# Patient Record
Sex: Male | Born: 1950 | Race: White | Hispanic: No | Marital: Married | Smoking: Former smoker
Health system: Southern US, Community
[De-identification: ages and names within clinical notes are randomized; demographics above are authoritative.]

## PROBLEM LIST (undated history)

## (undated) DIAGNOSIS — E785 Hyperlipidemia, unspecified: Secondary | ICD-10-CM

## (undated) DIAGNOSIS — K625 Hemorrhage of anus and rectum: Secondary | ICD-10-CM

## (undated) DIAGNOSIS — D649 Anemia, unspecified: Secondary | ICD-10-CM

## (undated) HISTORY — DX: Hemorrhage of anus and rectum: K62.5

## (undated) HISTORY — PX: TONSILLECTOMY AND ADENOIDECTOMY: SHX28

## (undated) HISTORY — DX: Hyperlipidemia, unspecified: E78.5

## (undated) HISTORY — PX: OTHER SURGICAL HISTORY: SHX169

---

## 1988-05-09 HISTORY — PX: SHOULDER SURGERY: SHX246

## 2001-11-18 ENCOUNTER — Observation Stay (HOSPITAL_COMMUNITY): Admission: EM | Admit: 2001-11-18 | Discharge: 2001-11-19 | Payer: Self-pay | Admitting: Emergency Medicine

## 2001-11-28 ENCOUNTER — Encounter: Admission: RE | Admit: 2001-11-28 | Discharge: 2001-11-28 | Payer: Self-pay | Admitting: Internal Medicine

## 2004-09-15 ENCOUNTER — Ambulatory Visit (HOSPITAL_COMMUNITY): Admission: RE | Admit: 2004-09-15 | Discharge: 2004-09-15 | Payer: Self-pay | Admitting: Orthopedic Surgery

## 2005-11-24 ENCOUNTER — Ambulatory Visit: Payer: Self-pay

## 2010-05-30 ENCOUNTER — Encounter: Payer: Self-pay | Admitting: Orthopedic Surgery

## 2011-01-19 ENCOUNTER — Ambulatory Visit (INDEPENDENT_AMBULATORY_CARE_PROVIDER_SITE_OTHER): Payer: 59 | Admitting: Surgery

## 2011-01-19 ENCOUNTER — Encounter (INDEPENDENT_AMBULATORY_CARE_PROVIDER_SITE_OTHER): Payer: Self-pay | Admitting: Surgery

## 2011-01-19 VITALS — BP 108/74 | HR 60 | Temp 96.8°F | Ht 71.0 in | Wt 163.1 lb

## 2011-01-19 DIAGNOSIS — K4020 Bilateral inguinal hernia, without obstruction or gangrene, not specified as recurrent: Secondary | ICD-10-CM | POA: Insufficient documentation

## 2011-01-19 DIAGNOSIS — R1031 Right lower quadrant pain: Secondary | ICD-10-CM

## 2011-01-19 DIAGNOSIS — K4090 Unilateral inguinal hernia, without obstruction or gangrene, not specified as recurrent: Secondary | ICD-10-CM

## 2011-01-19 NOTE — Progress Notes (Signed)
Subjective:     Patient ID: David Reyes, male   DOB: [birth year], 60 y.o.   MRN: [id number]  HPI  Reason for visit: Left groin swelling. Question of hernia.  Patient is a active male who noticed left groin pain and swelling earlier this year. He has a friend whom I did a repair of hernia & he recommended to come & be seen by me. The patient notes that the areas gotten more sensitive and larger in size. It gets worse when he feels constipated. He also gets worse when he is working. 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 He noticed some discomfort on the right side as well. He is due to get a colonoscopy. He has a bowel movement every day but claims he gets constipated at times. No prior abdominal nor hernia surgeries.  Past Medical History  Diagnosis Date  . Hyperlipidemia   . Constipation   . Abdominal pain   . Rectal bleeding    Past Surgical History  Procedure Date  . Tonsillectomy and adenoidectomy   . Dental implants    Current outpatient prescriptions:aspirin 200 MG suppository, Place 200 mg rectally every 6 (six) hours as needed.  , Disp: , Rfl: ;  CIALIS 5 MG tablet, , Disp: , Rfl: ;  clobetasol (TEMOVATE) 0.05 % cream, , Disp: , Rfl: ;  fluocinonide (LIDEX) 0.05 % external solution, , Disp: , Rfl: ;  Glucosamine-Chondroit-Vit C-Mn (GLUCOSAMINE 1500 COMPLEX PO), Take by mouth daily.  , Disp: , Rfl:  MINOXIDIL, TOPICAL, (ROGAINE EXTRA STRENGTH) 5 % SOLN, Apply topically 2 (two) times daily.  , Disp: , Rfl: ;  Multiple Vitamin (MULTIVITAMIN PO), Take by mouth daily.  , Disp: , Rfl: ;  Red Yeast Rice Extract (RED YEAST RICE PO), Take 1,200 mg elemental calcium/kg/hr by mouth.  , Disp: , Rfl: ;  SUPREP BOWEL PREP SOLN, , Disp: , Rfl:   No Known Allergies  History   Social History  . Marital Status: Married    Spouse Name: N/A    Number of Children: N/A  . Years of Education: N/A   Occupational History  . Not  on file.   Social History Main Topics  . Smoking status: Current Everyday Smoker -- 0.5 packs/day for 15 years  . Smokeless tobacco: Not on file  . Alcohol Use: No  . Drug Use: No  . Sexually Active:    Other Topics Concern  . Not on file   Social History Narrative  . No narrative on file     Review of Systems  Constitutional: Negative for fever, chills and diaphoresis.  HENT: Negative for nosebleeds, sore throat, facial swelling, mouth sores, trouble swallowing and ear discharge.   Eyes: Negative for photophobia, discharge and visual disturbance.  Respiratory: Negative for choking, chest tightness, shortness of breath and stridor.   Cardiovascular: Negative for chest pain, palpitations and leg swelling.       Walks 2 miles/day w/o problems  Gastrointestinal: Negative for nausea, vomiting, abdominal pain, diarrhea, constipation, blood in stool, abdominal distention, anal bleeding and rectal pain.  Genitourinary: Negative for dysuria, urgency, frequency, discharge, penile swelling, scrotal swelling, difficulty urinating, penile pain and testicular pain.  Musculoskeletal: Negative for myalgias, back pain, arthralgias and gait problem.  Skin: Negative for color change, pallor, rash and wound.  Neurological: Negative for dizziness, speech difficulty, weakness, numbness and headaches.  Hematological: Negative for adenopathy. Does not bruise/bleed easily.  Psychiatric/Behavioral: Negative for hallucinations, confusion and agitation.       Objective:   Physical Exam  Constitutional: He is oriented to person, place, and time. He appears well-developed and well-nourished. No distress.  HENT:  Head: Normocephalic.  Mouth/Throat: Oropharynx is clear and moist. No oropharyngeal exudate.  Eyes: Conjunctivae and EOM are normal. Pupils are equal, round, and reactive to light. No scleral icterus.  Neck: Normal range of motion. Neck supple. No tracheal deviation present.  Cardiovascular:  Normal rate, regular rhythm and intact distal pulses.   Pulmonary/Chest: Effort normal and breath sounds normal. No respiratory distress.  Abdominal: Soft. He exhibits no distension and no mass. There is no tenderness. There is no rebound and no guarding. Hernia confirmed negative in the right inguinal area and confirmed negative in the left inguinal area.  Genitourinary: Penis normal. No penile tenderness.       Small LIH.  Possible RIH on Valsalva  Musculoskeletal: Normal range of motion. He exhibits no tenderness.  Lymphadenopathy:    He has no cervical adenopathy.       Right: No inguinal adenopathy present.       Left: No inguinal adenopathy present.  Neurological: He is alert and oriented to person, place, and time. No cranial nerve deficit. He exhibits normal muscle tone. Coordination normal.  Skin: Skin is warm and dry. No rash noted. He is not diaphoretic. No erythema. No pallor.  Psychiatric: He has a normal mood and affect. His behavior is normal. Judgment and thought content normal.       Assessment:     Small but definite left inguinal hernia. Probable right inguinal hernia as well.    Plan:     I think he would benefit from exploration of both groins laparoscopically. He wishes to try and get this done before the end of the month. I spent a long period time clarifying the pathophysiology and the technique.  The anatomy & physiology of the abdominal wall was discussed.  The pathophysiology of hernias was discussed.  Natural history risks without surgery of enlargement, pain, incarceration & strangulation was discussed.   Contributors to complications such as smoking, obesity, diabetes, prior surgery, etc were discussed.  I feel the risks of no intervention will lead to serious problems that outweigh the operative risks; therefore, I recommended surgery to reduce and repair the hernia.  I explained laparoscopic techniques with possible need for an open approach.  I noted the  probable use of mesh to patch and/or buttress hernia repair  Risks such as bleeding, infection, abscess, need for further treatment, heart attack, death, and other risks were discussed.  Goals of post-operative recovery were discussed as well.  Possibility that this will not correct all symptoms was explained.  I stressed the importance of low-impact activity, aggressive pain control, avoiding constipation, & not pushing through pain to minimize risk of post-operative chronic pain or injury. Possibility of reherniation was discussed.  We will work to minimize complications.   An educational handout further explaining the pathology & treatment options was given as well.  Questions were answered.  The patient expresses understanding & wishes to proceed with surgery.

## 2011-01-19 NOTE — Patient Instructions (Signed)
See Handout.  Consider Surgery to repair hernia(s) in groin(s).

## 2011-01-27 DIAGNOSIS — K4020 Bilateral inguinal hernia, without obstruction or gangrene, not specified as recurrent: Secondary | ICD-10-CM

## 2011-01-27 HISTORY — PX: HERNIA REPAIR: SHX51

## 2011-02-07 ENCOUNTER — Ambulatory Visit (INDEPENDENT_AMBULATORY_CARE_PROVIDER_SITE_OTHER): Payer: 59 | Admitting: Surgery

## 2011-02-07 ENCOUNTER — Encounter (INDEPENDENT_AMBULATORY_CARE_PROVIDER_SITE_OTHER): Payer: 59 | Admitting: Surgery

## 2011-02-07 ENCOUNTER — Encounter (INDEPENDENT_AMBULATORY_CARE_PROVIDER_SITE_OTHER): Payer: Self-pay | Admitting: Surgery

## 2011-02-07 VITALS — BP 108/78 | HR 72 | Temp 96.2°F | Resp 16 | Ht 70.5 in | Wt 160.1 lb

## 2011-02-07 DIAGNOSIS — K649 Unspecified hemorrhoids: Secondary | ICD-10-CM | POA: Insufficient documentation

## 2011-02-07 DIAGNOSIS — K4020 Bilateral inguinal hernia, without obstruction or gangrene, not specified as recurrent: Secondary | ICD-10-CM

## 2011-02-07 NOTE — Progress Notes (Signed)
Subjective:     Patient ID: David Reyes, male   DOB: Jan 01, 1951, 60 y.o.   MRN: [id number]  HPI  Patient Care Team: Verneita Griffes as PCP - General (Family Medicine)  This patient is a 60 y.o.male who presents today for surgical evaluation.   Diagnosis: Bilateral inguinal hernias  Procedure: Laparoscopic bilateral inguinal hernia repair 01/27/2011  Reason for visit: Followup  Patient comes today feeling better. He had some bad constipation the first 2 days. Eventually, he is a bowel movement on postop day #3. It was rather uncomfortable for him. He noted his hemorrhoids flared up to form a lump. That is calming down. No hematochezia. No fevers chills or sweats.  He had moderate bruising as well. That has begun to nearly resolve. He did not tolerate the oxycodone well. It made him feel rather loopy and recurrent rather constipated. He's been using ibuprofen & it seems to help. He is hesitant to go out and walk and do his usual brisk exercise/walking.  No fevers chills or sweats. Urinating okay.   Past Medical History  Diagnosis Date  . Hyperlipidemia   . Constipation   . Abdominal pain   . Rectal bleeding     Past Surgical History  Procedure Date  . Tonsillectomy and adenoidectomy   . Dental implants   . Hernia repair 01/27/11    bilateral inguinal hernia     History   Social History  . Marital Status: Married    Spouse Name: N/A    Number of Children: N/A  . Years of Education: N/A   Occupational History  . Not on file.   Social History Main Topics  . Smoking status: Current Everyday Smoker -- 0.5 packs/day for 15 years  . Smokeless tobacco: Not on file  . Alcohol Use: No  . Drug Use: No  . Sexually Active:    Other Topics Concern  . Not on file   Social History Narrative  . No narrative on file    Family History  Problem Relation Age of Onset  . COPD Mother     Current outpatient prescriptions:aspirin 200 MG suppository, Place 200 mg rectally every  6 (six) hours as needed.  , Disp: , Rfl: ;  CIALIS 5 MG tablet, , Disp: , Rfl: ;  clobetasol (TEMOVATE) 0.05 % cream, , Disp: , Rfl: ;  fluocinonide (LIDEX) 0.05 % external solution, , Disp: , Rfl: ;  Glucosamine-Chondroit-Vit C-Mn (GLUCOSAMINE 1500 COMPLEX PO), Take by mouth daily.  , Disp: , Rfl:  MINOXIDIL, TOPICAL, (ROGAINE EXTRA STRENGTH) 5 % SOLN, Apply topically 2 (two) times daily.  , Disp: , Rfl: ;  Multiple Vitamin (MULTIVITAMIN PO), Take by mouth daily.  , Disp: , Rfl: ;  Red Yeast Rice Extract (RED YEAST RICE PO), Take 1,200 mg elemental calcium/kg/hr by mouth.  , Disp: , Rfl: ;  SUPREP BOWEL PREP SOLN, , Disp: , Rfl:   No Known Allergies     Review of Systems  Constitutional: Negative for fever, chills and diaphoresis.  HENT: Negative for nosebleeds, sore throat, facial swelling, mouth sores, trouble swallowing, neck pain and ear discharge.   Eyes: Negative for photophobia, discharge and visual disturbance.  Respiratory: Negative for choking, chest tightness, shortness of breath and stridor.   Cardiovascular: Negative for chest pain and palpitations.  Gastrointestinal: Positive for constipation. Negative for nausea, vomiting, abdominal pain, diarrhea, blood in stool, abdominal distention, anal bleeding and rectal pain.       Better controlled w 5  prunes/day  Genitourinary: Negative for dysuria, urgency, difficulty urinating and testicular pain.  Musculoskeletal: Negative for myalgias, back pain, arthralgias and gait problem.  Skin: Negative for color change, pallor, rash and wound.  Neurological: Negative for dizziness, speech difficulty, weakness, numbness and headaches.  Hematological: Negative for adenopathy. Does not bruise/bleed easily.  Psychiatric/Behavioral: Negative for hallucinations, confusion and agitation.       Objective:   Physical Exam  Constitutional: He is oriented to person, place, and time. He appears well-developed and well-nourished. No distress.  HENT:    Head: Normocephalic.  Mouth/Throat: Oropharynx is clear and moist. No oropharyngeal exudate.  Eyes: Conjunctivae and EOM are normal. Pupils are equal, round, and reactive to light. No scleral icterus.  Neck: Normal range of motion. No tracheal deviation present.  Cardiovascular: Normal rate, normal heart sounds and intact distal pulses.   Pulmonary/Chest: Effort normal. No respiratory distress.  Abdominal: Soft. He exhibits no distension and no mass. There is no tenderness. There is no rebound and no guarding. Hernia confirmed negative in the right inguinal area and confirmed negative in the left inguinal area.       Incisions clean with normal healing ridges.  No hernias  Genitourinary: Penis normal. No penile tenderness.       Min ecchymosis on genitals.  Declined rectal exam  Musculoskeletal: Normal range of motion. He exhibits no tenderness.  Neurological: He is alert and oriented to person, place, and time. No cranial nerve deficit. He exhibits normal muscle tone. Coordination normal.  Skin: Skin is warm and dry. No rash noted. He is not diaphoretic. No erythema.  Psychiatric: He has a normal mood and affect. His behavior is normal.       Assessment:     POD #12 Status post laparoscopic repairs. Recovering well.  Hemorrhoids with recent flare of constipation.  Improved with adding prunes   Plan:     Okay to walk. Do not push through pain. Increase nonsteroidal use if needed to have better exercise tolerance.  I discussed about hemorrhoids.  The anatomy & physiology of the anorectal region was discussed.  The pathophysiology of hemorrhoids and differential diagnosis was discussed.  Natural history progression  was discussed.   I stressed the importance of a bowel regimen to have daily soft bowel movements to minimize progression of disease.   Educational handouts further explaining the pathology, treatment options, and bowel regimen were given as well.   It does not sound like a  fissure or fistula. I noted if things do not improve I can examine the area. However, it seems mild now and improving. He felt reassured  Since he has made improvements less than 2 weeks out, I think he can return to clinic p.r.n. He felt comfortable calling me if things do not improve or he worsens.

## 2011-02-07 NOTE — Patient Instructions (Signed)

## 2011-04-06 ENCOUNTER — Encounter (INDEPENDENT_AMBULATORY_CARE_PROVIDER_SITE_OTHER): Payer: Self-pay

## 2017-12-13 ENCOUNTER — Ambulatory Visit
Admission: RE | Admit: 2017-12-13 | Discharge: 2017-12-13 | Disposition: A | Discharge status: Home / Self Care | Payer: 59 | Source: Ambulatory Visit | Attending: Adult Health | Admitting: Adult Health

## 2017-12-13 ENCOUNTER — Other Ambulatory Visit: Payer: Self-pay | Admitting: Adult Health

## 2017-12-13 DIAGNOSIS — R599 Enlarged lymph nodes, unspecified: Secondary | ICD-10-CM

## 2018-01-24 ENCOUNTER — Other Ambulatory Visit: Payer: Self-pay | Admitting: Adult Health

## 2018-01-24 DIAGNOSIS — K5900 Constipation, unspecified: Secondary | ICD-10-CM

## 2018-01-24 DIAGNOSIS — Z136 Encounter for screening for cardiovascular disorders: Secondary | ICD-10-CM

## 2018-02-19 ENCOUNTER — Ambulatory Visit
Admission: RE | Admit: 2018-02-19 | Discharge: 2018-02-19 | Disposition: A | Discharge status: Home / Self Care | Payer: Managed Care, Other (non HMO) | Source: Ambulatory Visit | Attending: Adult Health | Admitting: Adult Health

## 2018-02-19 DIAGNOSIS — Z136 Encounter for screening for cardiovascular disorders: Secondary | ICD-10-CM

## 2018-03-23 ENCOUNTER — Telehealth: Payer: Self-pay | Admitting: Gastroenterology

## 2018-03-23 NOTE — Telephone Encounter (Signed)
I reviewed the notes. Multiple Cold snare and hot snare polypectomies. I think scheduling colonoscopy is reasonable, but please get the pathology from the polyps. Thanks.

## 2018-04-12 NOTE — Telephone Encounter (Signed)
Called and LM on Vmail to call back °

## 2018-06-26 ENCOUNTER — Other Ambulatory Visit: Payer: Self-pay | Admitting: Orthopedic Surgery

## 2018-06-29 ENCOUNTER — Other Ambulatory Visit: Payer: Self-pay

## 2018-06-29 ENCOUNTER — Encounter (HOSPITAL_BASED_OUTPATIENT_CLINIC_OR_DEPARTMENT_OTHER): Payer: Self-pay | Admitting: *Deleted

## 2018-07-10 ENCOUNTER — Encounter (HOSPITAL_BASED_OUTPATIENT_CLINIC_OR_DEPARTMENT_OTHER): Payer: Self-pay | Admitting: *Deleted

## 2018-07-10 ENCOUNTER — Ambulatory Visit (HOSPITAL_BASED_OUTPATIENT_CLINIC_OR_DEPARTMENT_OTHER): Payer: Managed Care, Other (non HMO) | Admitting: Certified Registered"

## 2018-07-10 ENCOUNTER — Encounter (HOSPITAL_BASED_OUTPATIENT_CLINIC_OR_DEPARTMENT_OTHER)
Admission: RE | Disposition: A | Discharge status: Home / Self Care | Payer: Self-pay | Source: Home / Self Care | Attending: Orthopedic Surgery

## 2018-07-10 ENCOUNTER — Ambulatory Visit (HOSPITAL_BASED_OUTPATIENT_CLINIC_OR_DEPARTMENT_OTHER)
Admission: RE | Admit: 2018-07-10 | Discharge: 2018-07-10 | Disposition: A | Discharge status: Home / Self Care | Payer: Managed Care, Other (non HMO) | Attending: Orthopedic Surgery | Admitting: Orthopedic Surgery

## 2018-07-10 ENCOUNTER — Other Ambulatory Visit: Payer: Self-pay

## 2018-07-10 DIAGNOSIS — F17200 Nicotine dependence, unspecified, uncomplicated: Secondary | ICD-10-CM | POA: Insufficient documentation

## 2018-07-10 DIAGNOSIS — G5603 Carpal tunnel syndrome, bilateral upper limbs: Secondary | ICD-10-CM | POA: Insufficient documentation

## 2018-07-10 DIAGNOSIS — E785 Hyperlipidemia, unspecified: Secondary | ICD-10-CM | POA: Insufficient documentation

## 2018-07-10 HISTORY — PX: CARPAL TUNNEL RELEASE: SHX101

## 2018-07-10 HISTORY — DX: Anemia, unspecified: D64.9

## 2018-07-10 SURGERY — CARPAL TUNNEL RELEASE
Anesthesia: Regional | Site: Wrist | Laterality: Right

## 2018-07-10 MED ORDER — BUPIVACAINE HCL (PF) 0.25 % IJ SOLN
INTRAMUSCULAR | Status: DC | PRN
  Administered 2018-07-10: 10 mL

## 2018-07-10 MED ORDER — ONDANSETRON HCL 4 MG/2ML IJ SOLN
INTRAMUSCULAR | Status: DC | PRN
  Administered 2018-07-10: 4 mg via INTRAVENOUS

## 2018-07-10 MED ORDER — ONDANSETRON HCL 4 MG/2ML IJ SOLN
INTRAMUSCULAR | Status: AC
  Filled 2018-07-10: qty 2

## 2018-07-10 MED ORDER — MIDAZOLAM HCL 2 MG/2ML IJ SOLN
INTRAMUSCULAR | Status: AC
  Filled 2018-07-10: qty 2

## 2018-07-10 MED ORDER — FENTANYL CITRATE (PF) 100 MCG/2ML IJ SOLN
50.0000 ug | INTRAMUSCULAR | Status: DC | PRN
  Administered 2018-07-10: 50 ug via INTRAVENOUS

## 2018-07-10 MED ORDER — CEFAZOLIN SODIUM-DEXTROSE 2-4 GM/100ML-% IV SOLN
INTRAVENOUS | Status: AC
  Filled 2018-07-10: qty 100

## 2018-07-10 MED ORDER — CHLORHEXIDINE GLUCONATE 4 % EX LIQD: 60.0000 mL | Freq: Once | CUTANEOUS | Status: DC

## 2018-07-10 MED ORDER — PROPOFOL 500 MG/50ML IV EMUL
INTRAVENOUS | Status: AC
  Filled 2018-07-10: qty 50

## 2018-07-10 MED ORDER — CEFAZOLIN SODIUM-DEXTROSE 2-4 GM/100ML-% IV SOLN
2.0000 g | INTRAVENOUS | Status: AC
  Administered 2018-07-10: 2 g via INTRAVENOUS

## 2018-07-10 MED ORDER — MIDAZOLAM HCL 2 MG/2ML IJ SOLN
1.0000 mg | INTRAMUSCULAR | Status: DC | PRN
  Administered 2018-07-10: 1 mg via INTRAVENOUS

## 2018-07-10 MED ORDER — LACTATED RINGERS IV SOLN
INTRAVENOUS | Status: DC
  Administered 2018-07-10: 11:00:00 via INTRAVENOUS

## 2018-07-10 MED ORDER — SCOPOLAMINE 1 MG/3DAYS TD PT72: 1.0000 | MEDICATED_PATCH | Freq: Once | TRANSDERMAL | Status: DC | PRN

## 2018-07-10 MED ORDER — PROPOFOL 500 MG/50ML IV EMUL
INTRAVENOUS | Status: DC | PRN
  Administered 2018-07-10: 50 ug/kg/min via INTRAVENOUS

## 2018-07-10 MED ORDER — LIDOCAINE HCL (PF) 0.5 % IJ SOLN
INTRAMUSCULAR | Status: DC | PRN
  Administered 2018-07-10: 35 mL via INTRAVENOUS

## 2018-07-10 MED ORDER — TRAMADOL HCL 50 MG PO TABS: 50.0000 mg | ORAL_TABLET | Freq: Four times a day (QID) | ORAL | 0 refills | Status: DC | PRN

## 2018-07-10 MED ORDER — FENTANYL CITRATE (PF) 100 MCG/2ML IJ SOLN
INTRAMUSCULAR | Status: AC
  Filled 2018-07-10: qty 2

## 2018-07-10 SURGICAL SUPPLY — 39 items
BLADE SURG 15 STRL LF DISP TIS (BLADE) ×1 IMPLANT
BLADE SURG 15 STRL SS (BLADE) ×3
BNDG CMPR 9X4 STRL LF SNTH (GAUZE/BANDAGES/DRESSINGS) ×1
BNDG COHESIVE 3X5 TAN STRL LF (GAUZE/BANDAGES/DRESSINGS) ×3 IMPLANT
BNDG ESMARK 4X9 LF (GAUZE/BANDAGES/DRESSINGS) ×2 IMPLANT
BNDG GAUZE ELAST 4 BULKY (GAUZE/BANDAGES/DRESSINGS) ×3 IMPLANT
CHLORAPREP W/TINT 26ML (MISCELLANEOUS) ×3 IMPLANT
CORD BIPOLAR FORCEPS 12FT (ELECTRODE) ×3 IMPLANT
COVER BACK TABLE 60X90IN (DRAPES) ×3 IMPLANT
COVER MAYO STAND STRL (DRAPES) ×3 IMPLANT
COVER WAND RF STERILE (DRAPES) IMPLANT
CUFF TOURNIQUET SINGLE 18IN (TOURNIQUET CUFF) ×3 IMPLANT
DRAPE EXTREMITY T 121X128X90 (DISPOSABLE) ×3 IMPLANT
DRAPE SURG 17X23 STRL (DRAPES) ×3 IMPLANT
DRSG PAD ABDOMINAL 8X10 ST (GAUZE/BANDAGES/DRESSINGS) ×3 IMPLANT
GAUZE SPONGE 4X4 12PLY STRL (GAUZE/BANDAGES/DRESSINGS) ×3 IMPLANT
GAUZE XEROFORM 1X8 LF (GAUZE/BANDAGES/DRESSINGS) ×3 IMPLANT
GLOVE BIOGEL PI IND STRL 6.5 (GLOVE) IMPLANT
GLOVE BIOGEL PI IND STRL 7.0 (GLOVE) IMPLANT
GLOVE BIOGEL PI IND STRL 8.5 (GLOVE) ×1 IMPLANT
GLOVE BIOGEL PI INDICATOR 6.5 (GLOVE) ×2
GLOVE BIOGEL PI INDICATOR 7.0 (GLOVE) ×2
GLOVE BIOGEL PI INDICATOR 8.5 (GLOVE) ×2
GLOVE SURG ORTHO 8.0 STRL STRW (GLOVE) ×3 IMPLANT
GLOVE SURG SS PI 6.5 STRL IVOR (GLOVE) ×2 IMPLANT
GOWN STRL REUS W/ TWL LRG LVL3 (GOWN DISPOSABLE) ×1 IMPLANT
GOWN STRL REUS W/TWL LRG LVL3 (GOWN DISPOSABLE) ×3
GOWN STRL REUS W/TWL XL LVL3 (GOWN DISPOSABLE) ×3 IMPLANT
NDL PRECISIONGLIDE 27X1.5 (NEEDLE) IMPLANT
NEEDLE PRECISIONGLIDE 27X1.5 (NEEDLE) ×3 IMPLANT
NS IRRIG 1000ML POUR BTL (IV SOLUTION) ×3 IMPLANT
PACK BASIN DAY SURGERY FS (CUSTOM PROCEDURE TRAY) ×3 IMPLANT
STOCKINETTE 4X48 STRL (DRAPES) ×3 IMPLANT
SUT ETHILON 4 0 PS 2 18 (SUTURE) ×3 IMPLANT
SUT VICRYL 4-0 PS2 18IN ABS (SUTURE) IMPLANT
SYR BULB 3OZ (MISCELLANEOUS) ×3 IMPLANT
SYR CONTROL 10ML LL (SYRINGE) IMPLANT
TOWEL GREEN STERILE FF (TOWEL DISPOSABLE) ×3 IMPLANT
UNDERPAD 30X30 (UNDERPADS AND DIAPERS) ×1 IMPLANT

## 2018-07-10 NOTE — Anesthesia Postprocedure Evaluation (Signed)
Anesthesia Post Note  Patient: David Reyes  Procedure(s) Performed: CARPAL TUNNEL RELEASE (Right Wrist)     Patient location during evaluation: PACU Anesthesia Type: Bier Block Level of consciousness: awake and alert Pain management: pain level controlled Vital Signs Assessment: post-procedure vital signs reviewed and stable Respiratory status: spontaneous breathing, nonlabored ventilation and respiratory function stable Cardiovascular status: blood pressure returned to baseline and stable Postop Assessment: no apparent nausea or vomiting Anesthetic complications: no    Last Vitals:  Vitals:   07/10/18 1248 07/10/18 1300  BP:  122/71  Pulse: (!) 55 62  Resp: 14 18  Temp:  36.6 C  SpO2: 100% 96%    Last Pain:  Vitals:   07/10/18 1300  TempSrc:   PainSc: 0-No pain                 Lucretia Kern

## 2018-07-10 NOTE — Brief Op Note (Signed)
07/10/2018  12:33 PM  PATIENT:  David Reyes  68 y.o. male  PRE-OPERATIVE DIAGNOSIS:  RIGHT CARPAL TUNNEL SYNDROME  POST-OPERATIVE DIAGNOSIS:  RIGHT CARPAL TUNNEL SYNDROME  PROCEDURE:  Procedure(s) with comments: CARPAL TUNNEL RELEASE (Right) - FAB  SURGEON:  Surgeon(s) and Role:    * Cindee Salt, MD - Primary  PHYSICIAN ASSISTANT:   ASSISTANTS: none   ANESTHESIA:   local, regional and IV sedation  EBL:  60ml  BLOOD ADMINISTERED:none  DRAINS: none   LOCAL MEDICATIONS USED:  BUPIVICAINE   SPECIMEN:  No Specimen  DISPOSITION OF SPECIMEN:  N/A  COUNTS:  YES  TOURNIQUET:   Total Tourniquet Time Documented: Forearm (Right) - 24 minutes Total: Forearm (Right) - 24 minutes   DICTATION: .Dragon Dictation  PLAN OF CARE: Discharge to home after PACU  PATIENT DISPOSITION:  PACU - hemodynamically stable.

## 2018-07-10 NOTE — Op Note (Signed)
NAME: David Reyes MEDICAL RECORD NO: [id number] DATE OF BIRTH: 1950/09/21 FACILITY: [facility] LOCATION: [facility] PHYSICIAN: Nicki Reaper, MD   OPERATIVE REPORT   DATE OF PROCEDURE: 07/10/18    PREOPERATIVE DIAGNOSIS:   Carpal tunnel syndrome right hand   POSTOPERATIVE DIAGNOSIS:   Same   PROCEDURE:   Decompression median nerve right hand   SURGEON: Cindee Salt, M.D.   ASSISTANT: none   ANESTHESIA:  Bier block with sedation and Local   INTRAVENOUS FLUIDS:  Per anesthesia flow sheet.   ESTIMATED BLOOD LOSS:  Minimal.   COMPLICATIONS:  None.   SPECIMENS:  none   TOURNIQUET TIME:    Total Tourniquet Time Documented: Forearm (Right) - 24 minutes Total: Forearm (Right) - 24 minutes    DISPOSITION:  Stable to PACU.   INDICATIONS: Patient is a 68 year old male with a history of bilateral numbness and tingling.  Nerve conductions were revealed bilateral carpal tunnel syndrome.  This is not responded to conservative treatment he is elected to undergo surgical intervention with decompression median nerve right side.  Pre-peri-and postoperative course been discussed along with risks and complications.  He is aware there is no guarantee to the surgery the possibility of infection recurrence injury to arteries nerves tendons complete relief symptoms dystrophy he is advised we are attempting to halt the process of getting the nerve the opportunity to get better but cannot guarantee this.  The operative area the patient is seen extremity marked by both patient and surgeon antibiotic given  OPERATIVE COURSE: Patient is brought to the operating room where form based IV regional anesthetic was carried out without difficulty under the direction the anesthesia department.  He was prepped using ChloraPrep in the supine position with the right arm free.  A three-minute dry time was allowed timeout taken to confirm patient procedure.  A longitudinal incision was made in the right  palm carried down through subcutaneous tissue.  Bleeders were electrocauterized with bipolar.  The palmar fascia was split.  Superficial palmar arch was identified along with flexor tendon to the ring little finger.  Tractors were placed retracting median nerve radially ulnar nerve ulnarly.  The flexor retinaculum was then released on its ulnar border.  A right angle and stool retractor placed between the skin and forearm fascia.  The deep structures were dissected free from the proximal aspect of the flexor retinaculum distal forearm fascia and this was released with blunt scissors for approximately 2 to 3 cm proximal to the wrist crease under direct vision.  The canal was explored.  The nerve was noted be significantly inflamed with an hourglass deformity.  Motor branch entered the muscle distally.  No further lesions were identified.  The wound was copiously irrigated with saline.  The skin was closed interrupted 4-0 nylon sutures.  Local infiltration quarter strength bupivacaine without epinephrine was given approximately 9 cc was used.  A sterile compressive dressing with fingers 3 was applied.  Inflation the tourniquet all fingers immediately pink.  He was taken to the recovery room for observation in satisfactory condition.  He will be discharged home to return the hand center of [facility] in 1 week on Tylenol ibuprofen with Ultram as a backup for pain.   Cindee Salt, MD Electronically signed, 07/10/18

## 2018-07-10 NOTE — Anesthesia Preprocedure Evaluation (Addendum)
Anesthesia Evaluation  Patient identified by MRN, date of birth, ID band Patient awake    Reviewed: Allergy & Precautions, NPO status , Patient's Chart, lab work & pertinent test results  History of Anesthesia Complications Negative for: history of anesthetic complications  Airway Mallampati: II  TM Distance: >3 FB Neck ROM: Full    Dental no notable dental hx. (+) Teeth Intact   Pulmonary Current Smoker,    Pulmonary exam normal        Cardiovascular negative cardio ROS Normal cardiovascular exam     Neuro/Psych negative neurological ROS  negative psych ROS   GI/Hepatic negative GI ROS, Neg liver ROS,   Endo/Other  negative endocrine ROS  Renal/GU negative Renal ROS  negative genitourinary   Musculoskeletal negative musculoskeletal ROS (+)   Abdominal   Peds  Hematology  (+) anemia ,   Anesthesia Other Findings 68 yo M for carpal tunnel release - current smoker, HLD, anemia  Reproductive/Obstetrics                            Anesthesia Physical Anesthesia Plan  ASA: II  Anesthesia Plan: Bier Block and Bier Block-LIDOCAINE ONLY   Post-op Pain Management:    Induction:   PONV Risk Score and Plan: 0 and Propofol infusion and Treatment may vary due to age or medical condition  Airway Management Planned: Nasal Cannula and Simple Face Mask  Additional Equipment: None  Intra-op Plan:   Post-operative Plan:   Informed Consent: I have reviewed the patients History and Physical, chart, labs and discussed the procedure including the risks, benefits and alternatives for the proposed anesthesia with the patient or authorized representative who has indicated his/her understanding and acceptance.       Plan Discussed with:   Anesthesia Plan Comments:        Anesthesia Quick Evaluation

## 2018-07-10 NOTE — H&P (Signed)
  David Reyes is an 68 y.o. male.   Chief Complaint: numbness hands OIT:GPQDI is a 68 year old right-hand-dominant male with a complaint of numbness and tingling both hands all fingers on a constant basis right greater than left. This been going on for years. He was seen in 2006 told that he had carpal tunnel syndrome did not have anything done at that time. He is not complaining of any significant pain. He has no history of injury to the hand or to the neck he has been using a brace. He states nonuse is the only thing that seems to make it better. He is waking 2-3 out of 7 nights. He is not taking anything at the present time other than occasional Aleve. He has no history of diabetes thyroid problems arthritis or gout. Family history is positive for thyroid problems are negative for the remainder. He has been tested for thyroid problems. He was  referred for nerve conductions with Dr. Neldon Newport which he has had done. Feels bilateral carpal tunnel with enlargement of the median nerves bilaterally motor delay of 5.4 on his right side 5.2 on his left. He has sensory delays bilaterally.     Past Medical History:  Diagnosis Date  . Abdominal pain   . Anemia   . Constipation   . Hyperlipidemia   . Rectal bleeding     Past Surgical History:  Procedure Laterality Date  . dental implants    . HERNIA REPAIR  01/27/11   bilateral inguinal hernia   . SHOULDER SURGERY Right 1990  . TONSILLECTOMY AND ADENOIDECTOMY      Family History  Problem Relation Age of Onset  . COPD Mother    Social History:  reports that he has been smoking. He has a 7.50 pack-year smoking history. He has never used smokeless tobacco. He reports that he does not drink alcohol or use drugs.  Allergies: No Known Allergies  No medications prior to admission.    No results found for this or any previous visit (from the past 48 hour(s)).  No results found.   Pertinent items are noted in HPI.  Height 5\' 10"  (1.778 m), weight  70.3 kg.  General appearance: alert, cooperative and appears stated age Head: Normocephalic, without obvious abnormality Neck: no JVD Resp: clear to auscultation bilaterally Cardio: regular rate and rhythm, S1, S2 normal, no murmur, click, rub or gallop GI: soft, non-tender; bowel sounds normal; no masses,  no organomegaly Extremities: numbness right hand Pulses: 2+ and symmetric Skin: Skin color, texture, turgor normal. No rashes or lesions Neurologic: Grossly normal Incision/Wound: na  Assessment/Plan  Assessment:  1. Bilateral carpal tunnel syndrome    Plan: We have discussed the possibility of injections with him. He states he would like to have something more permanent. He would like to proceed to surgical intervention. Pre-peri-and postoperative course are discussed along with risks and complications. He is aware that there is no guarantee to the surgery the possibility of infection recurrence injury to arteries nerves tendons complete relief symptoms dystrophy all discussed with him. Shins are encouraged and answered to his satisfaction. Is scheduled for carpal tunnel release right hand as an outpatient under regional anesthesia.   Cindee Salt 07/10/2018, 10:35 AM

## 2018-07-10 NOTE — Discharge Instructions (Addendum)

## 2018-07-10 NOTE — Transfer of Care (Signed)
Immediate Anesthesia Transfer of Care Note  Patient: David Reyes  Procedure(s) Performed: CARPAL TUNNEL RELEASE (Right Wrist)  Patient Location: PACU  Anesthesia Type:MAC and Bier block  Level of Consciousness: awake, alert  and oriented  Airway & Oxygen Therapy: Patient Spontanous Breathing and Patient connected to face mask oxygen  Post-op Assessment: Report given to RN and Post -op Vital signs reviewed and stable  Post vital signs: Reviewed and stable  Last Vitals:  Vitals Value Taken Time  BP    Temp    Pulse 64 07/10/2018 12:33 PM  Resp 16 07/10/2018 12:33 PM  SpO2 99 % 07/10/2018 12:33 PM    Last Pain:  Vitals:   07/10/18 1104  TempSrc: Oral  PainSc: 0-No pain      Patients Stated Pain Goal: 0 (44/01/02 7253)  Complications: No apparent anesthesia complications

## 2018-07-11 ENCOUNTER — Encounter (HOSPITAL_BASED_OUTPATIENT_CLINIC_OR_DEPARTMENT_OTHER): Payer: Self-pay | Admitting: Orthopedic Surgery

## 2018-07-17 ENCOUNTER — Other Ambulatory Visit: Payer: Self-pay | Admitting: Orthopedic Surgery

## 2018-07-26 ENCOUNTER — Encounter: Payer: Self-pay | Admitting: Adult Health

## 2018-08-07 ENCOUNTER — Ambulatory Visit (HOSPITAL_BASED_OUTPATIENT_CLINIC_OR_DEPARTMENT_OTHER): Admit: 2018-08-07 | Payer: Managed Care, Other (non HMO) | Admitting: Orthopedic Surgery

## 2018-08-07 ENCOUNTER — Encounter (HOSPITAL_BASED_OUTPATIENT_CLINIC_OR_DEPARTMENT_OTHER): Payer: Self-pay

## 2018-08-07 SURGERY — CARPAL TUNNEL RELEASE
Anesthesia: Regional | Laterality: Left

## 2018-09-13 ENCOUNTER — Other Ambulatory Visit: Payer: Self-pay | Admitting: Orthopedic Surgery

## 2018-09-14 ENCOUNTER — Other Ambulatory Visit: Payer: Self-pay | Admitting: Orthopedic Surgery

## 2018-09-19 ENCOUNTER — Other Ambulatory Visit: Payer: Self-pay

## 2018-09-19 ENCOUNTER — Encounter (HOSPITAL_BASED_OUTPATIENT_CLINIC_OR_DEPARTMENT_OTHER): Payer: Self-pay

## 2018-09-21 ENCOUNTER — Other Ambulatory Visit (HOSPITAL_COMMUNITY)
Admission: RE | Admit: 2018-09-21 | Discharge: 2018-09-21 | Disposition: A | Discharge status: Home / Self Care | Payer: Managed Care, Other (non HMO) | Source: Ambulatory Visit | Attending: Orthopedic Surgery | Admitting: Orthopedic Surgery

## 2018-09-21 DIAGNOSIS — Z1159 Encounter for screening for other viral diseases: Secondary | ICD-10-CM | POA: Diagnosis not present

## 2018-09-22 LAB — NOVEL CORONAVIRUS, NAA (HOSP ORDER, SEND-OUT TO REF LAB; TAT 18-24 HRS): SARS-CoV-2, NAA: NOT DETECTED

## 2018-09-24 NOTE — Anesthesia Preprocedure Evaluation (Signed)
Anesthesia Evaluation  Patient identified by MRN, date of birth, ID band Patient awake    Reviewed: Allergy & Precautions, NPO status , Patient's Chart, lab work & pertinent test results  History of Anesthesia Complications Negative for: history of anesthetic complications  Airway Mallampati: II  TM Distance: >3 FB Neck ROM: Full    Dental no notable dental hx. (+) Teeth Intact   Pulmonary Current Smoker, former smoker,    Pulmonary exam normal        Cardiovascular negative cardio ROS Normal cardiovascular exam     Neuro/Psych negative neurological ROS  negative psych ROS   GI/Hepatic negative GI ROS, Neg liver ROS,   Endo/Other  negative endocrine ROS  Renal/GU negative Renal ROS  negative genitourinary   Musculoskeletal negative musculoskeletal ROS (+)   Abdominal   Peds  Hematology  (+) Blood dyscrasia, anemia ,   Anesthesia Other Findings 68 yo M for carpal tunnel release - current smoker, HLD, anemia  Reproductive/Obstetrics                             Anesthesia Physical  Anesthesia Plan  ASA: II  Anesthesia Plan: Bier Block and Bier Block-LIDOCAINE ONLY   Post-op Pain Management:    Induction:   PONV Risk Score and Plan: 0 and Propofol infusion and Treatment may vary due to age or medical condition  Airway Management Planned: Nasal Cannula and Simple Face Mask  Additional Equipment: None  Intra-op Plan:   Post-operative Plan:   Informed Consent: I have reviewed the patients History and Physical, chart, labs and discussed the procedure including the risks, benefits and alternatives for the proposed anesthesia with the patient or authorized representative who has indicated his/her understanding and acceptance.       Plan Discussed with: CRNA, Anesthesiologist and Surgeon  Anesthesia Plan Comments:         Anesthesia Quick Evaluation

## 2018-09-25 ENCOUNTER — Ambulatory Visit (HOSPITAL_BASED_OUTPATIENT_CLINIC_OR_DEPARTMENT_OTHER): Payer: Managed Care, Other (non HMO) | Admitting: Anesthesiology

## 2018-09-25 ENCOUNTER — Encounter (HOSPITAL_BASED_OUTPATIENT_CLINIC_OR_DEPARTMENT_OTHER): Payer: Self-pay | Admitting: *Deleted

## 2018-09-25 ENCOUNTER — Ambulatory Visit (HOSPITAL_BASED_OUTPATIENT_CLINIC_OR_DEPARTMENT_OTHER)
Admission: RE | Admit: 2018-09-25 | Discharge: 2018-09-25 | Disposition: A | Discharge status: Home / Self Care | Payer: Managed Care, Other (non HMO) | Attending: Orthopedic Surgery | Admitting: Orthopedic Surgery

## 2018-09-25 ENCOUNTER — Encounter (HOSPITAL_BASED_OUTPATIENT_CLINIC_OR_DEPARTMENT_OTHER)
Admission: RE | Disposition: A | Discharge status: Home / Self Care | Payer: Self-pay | Source: Home / Self Care | Attending: Orthopedic Surgery

## 2018-09-25 ENCOUNTER — Other Ambulatory Visit: Payer: Self-pay

## 2018-09-25 DIAGNOSIS — G5603 Carpal tunnel syndrome, bilateral upper limbs: Secondary | ICD-10-CM | POA: Diagnosis not present

## 2018-09-25 HISTORY — PX: CARPAL TUNNEL RELEASE: SHX101

## 2018-09-25 SURGERY — CARPAL TUNNEL RELEASE
Anesthesia: Regional | Site: Wrist | Laterality: Left

## 2018-09-25 MED ORDER — FENTANYL CITRATE (PF) 100 MCG/2ML IJ SOLN
INTRAMUSCULAR | Status: AC
  Filled 2018-09-25: qty 2

## 2018-09-25 MED ORDER — MIDAZOLAM HCL 2 MG/2ML IJ SOLN
INTRAMUSCULAR | Status: AC
  Filled 2018-09-25: qty 2

## 2018-09-25 MED ORDER — ACETAMINOPHEN 160 MG/5ML PO SOLN: 325.0000 mg | ORAL | Status: DC | PRN

## 2018-09-25 MED ORDER — ONDANSETRON HCL 4 MG/2ML IJ SOLN
INTRAMUSCULAR | Status: DC | PRN
  Administered 2018-09-25: 4 mg via INTRAVENOUS

## 2018-09-25 MED ORDER — CEFAZOLIN SODIUM-DEXTROSE 2-4 GM/100ML-% IV SOLN
INTRAVENOUS | Status: AC
  Filled 2018-09-25: qty 100

## 2018-09-25 MED ORDER — TRAMADOL HCL 50 MG PO TABS: 50.0000 mg | ORAL_TABLET | Freq: Four times a day (QID) | ORAL | 0 refills | Status: AC | PRN

## 2018-09-25 MED ORDER — FENTANYL CITRATE (PF) 100 MCG/2ML IJ SOLN
INTRAMUSCULAR | Status: DC | PRN
  Administered 2018-09-25: 50 ug via INTRAVENOUS

## 2018-09-25 MED ORDER — FENTANYL CITRATE (PF) 100 MCG/2ML IJ SOLN: 25.0000 ug | INTRAMUSCULAR | Status: DC | PRN

## 2018-09-25 MED ORDER — OXYCODONE HCL 5 MG PO TABS: 5.0000 mg | ORAL_TABLET | Freq: Once | ORAL | Status: DC | PRN

## 2018-09-25 MED ORDER — PROPOFOL 500 MG/50ML IV EMUL
INTRAVENOUS | Status: DC | PRN
  Administered 2018-09-25: 75 ug/kg/min via INTRAVENOUS

## 2018-09-25 MED ORDER — BUPIVACAINE HCL (PF) 0.25 % IJ SOLN
INTRAMUSCULAR | Status: DC | PRN
  Administered 2018-09-25: 10 mL

## 2018-09-25 MED ORDER — LIDOCAINE 2% (20 MG/ML) 5 ML SYRINGE
INTRAMUSCULAR | Status: AC
  Filled 2018-09-25: qty 5

## 2018-09-25 MED ORDER — ACETAMINOPHEN 325 MG PO TABS: 325.0000 mg | ORAL_TABLET | ORAL | Status: DC | PRN

## 2018-09-25 MED ORDER — OXYCODONE HCL 5 MG/5ML PO SOLN: 5.0000 mg | Freq: Once | ORAL | Status: DC | PRN

## 2018-09-25 MED ORDER — MEPERIDINE HCL 25 MG/ML IJ SOLN: 6.2500 mg | INTRAMUSCULAR | Status: DC | PRN

## 2018-09-25 MED ORDER — PROPOFOL 500 MG/50ML IV EMUL
INTRAVENOUS | Status: AC
  Filled 2018-09-25: qty 50

## 2018-09-25 MED ORDER — MIDAZOLAM HCL 5 MG/5ML IJ SOLN
INTRAMUSCULAR | Status: DC | PRN
  Administered 2018-09-25 (×2): 1 mg via INTRAVENOUS

## 2018-09-25 MED ORDER — CEFAZOLIN SODIUM-DEXTROSE 2-3 GM-%(50ML) IV SOLR
INTRAVENOUS | Status: DC | PRN
  Administered 2018-09-25: 2 g via INTRAVENOUS

## 2018-09-25 MED ORDER — BUPIVACAINE HCL (PF) 0.25 % IJ SOLN
INTRAMUSCULAR | Status: AC
  Filled 2018-09-25: qty 30

## 2018-09-25 MED ORDER — ONDANSETRON HCL 4 MG/2ML IJ SOLN: 4.0000 mg | Freq: Once | INTRAMUSCULAR | Status: DC | PRN

## 2018-09-25 MED ORDER — DEXAMETHASONE SODIUM PHOSPHATE 10 MG/ML IJ SOLN
INTRAMUSCULAR | Status: AC
  Filled 2018-09-25: qty 1

## 2018-09-25 MED ORDER — ONDANSETRON HCL 4 MG/2ML IJ SOLN
INTRAMUSCULAR | Status: AC
  Filled 2018-09-25: qty 2

## 2018-09-25 MED ORDER — LACTATED RINGERS IV SOLN
INTRAVENOUS | Status: DC | PRN
  Administered 2018-09-25: 09:00:00 via INTRAVENOUS

## 2018-09-25 SURGICAL SUPPLY — 39 items
APL PRP STRL LF DISP 70% ISPRP (MISCELLANEOUS) ×1
BLADE SURG 15 STRL LF DISP TIS (BLADE) ×1 IMPLANT
BLADE SURG 15 STRL SS (BLADE) ×3
BNDG CMPR 9X4 STRL LF SNTH (GAUZE/BANDAGES/DRESSINGS)
BNDG COHESIVE 3X5 TAN STRL LF (GAUZE/BANDAGES/DRESSINGS) ×3 IMPLANT
BNDG ESMARK 4X9 LF (GAUZE/BANDAGES/DRESSINGS) IMPLANT
BNDG GAUZE ELAST 4 BULKY (GAUZE/BANDAGES/DRESSINGS) ×3 IMPLANT
CHLORAPREP W/TINT 26 (MISCELLANEOUS) ×3 IMPLANT
CORD BIPOLAR FORCEPS 12FT (ELECTRODE) ×3 IMPLANT
COVER BACK TABLE REUSABLE LG (DRAPES) ×3 IMPLANT
COVER MAYO STAND REUSABLE (DRAPES) ×3 IMPLANT
COVER WAND RF STERILE (DRAPES) IMPLANT
CUFF TOURN SGL QUICK 18X4 (TOURNIQUET CUFF) ×3 IMPLANT
DRAPE EXTREMITY T 121X128X90 (DISPOSABLE) ×3 IMPLANT
DRAPE SURG 17X23 STRL (DRAPES) ×3 IMPLANT
DRSG PAD ABDOMINAL 8X10 ST (GAUZE/BANDAGES/DRESSINGS) ×3 IMPLANT
GAUZE SPONGE 4X4 12PLY STRL (GAUZE/BANDAGES/DRESSINGS) ×3 IMPLANT
GAUZE XEROFORM 1X8 LF (GAUZE/BANDAGES/DRESSINGS) ×3 IMPLANT
GLOVE BIO SURGEON STRL SZ 6.5 (GLOVE) ×1 IMPLANT
GLOVE BIO SURGEONS STRL SZ 6.5 (GLOVE) ×1
GLOVE BIOGEL PI IND STRL 7.0 (GLOVE) IMPLANT
GLOVE BIOGEL PI IND STRL 8.5 (GLOVE) ×1 IMPLANT
GLOVE BIOGEL PI INDICATOR 7.0 (GLOVE) ×4
GLOVE BIOGEL PI INDICATOR 8.5 (GLOVE) ×2
GLOVE SURG ORTHO 8.0 STRL STRW (GLOVE) ×3 IMPLANT
GOWN STRL REUS W/ TWL LRG LVL3 (GOWN DISPOSABLE) ×1 IMPLANT
GOWN STRL REUS W/TWL LRG LVL3 (GOWN DISPOSABLE) ×3
GOWN STRL REUS W/TWL XL LVL3 (GOWN DISPOSABLE) ×3 IMPLANT
NDL PRECISIONGLIDE 27X1.5 (NEEDLE) IMPLANT
NEEDLE PRECISIONGLIDE 27X1.5 (NEEDLE) IMPLANT
NS IRRIG 1000ML POUR BTL (IV SOLUTION) ×3 IMPLANT
PACK BASIN DAY SURGERY FS (CUSTOM PROCEDURE TRAY) ×3 IMPLANT
STOCKINETTE 4X48 STRL (DRAPES) ×3 IMPLANT
SUT ETHILON 4 0 PS 2 18 (SUTURE) ×3 IMPLANT
SUT VICRYL 4-0 PS2 18IN ABS (SUTURE) IMPLANT
SYR BULB 3OZ (MISCELLANEOUS) ×3 IMPLANT
SYR CONTROL 10ML LL (SYRINGE) IMPLANT
TOWEL GREEN STERILE FF (TOWEL DISPOSABLE) ×3 IMPLANT
UNDERPAD 30X30 (UNDERPADS AND DIAPERS) ×3 IMPLANT

## 2018-09-25 NOTE — H&P (Signed)
David Reyes is an 68 y.o. male.   Chief Complaint:  HPI: David Reyes is a 68 year old right-hand-dominant male with a complaint of numbness and tingling both hands all fingers on a constant basis right greater than left. This been going on for years. He was seen in 2006 told that he had carpal tunnel syndrome did not have anything done at that time. He is not complaining of any significant pain. He has no history of injury to the hand or to the neck he has been using a brace. He states nonuse is the only thing that seems to make it better. He is waking 2-3 out of 7 nights. He is not taking anything at the present time other than occasional Aleve. He has no history of diabetes thyroid problems arthritis or gout. Family history is positive for thyroid problems are negative for the remainder. He has been tested for thyroid problems. He was referred for nerve conductions with Dr. Neldon Newport which he has had done. Feels bilateral carpal tunnel with enlargement of the median nerves bilaterally motor delay of 5.4 on his right side 5.2 on his left. He has sensory delays bilaterally.   He has undergone right carpal tunnel release.   Past Medical History:  Diagnosis Date  . Abdominal pain   . Anemia   . Constipation   . Hyperlipidemia   . Rectal bleeding     Past Surgical History:  Procedure Laterality Date  . CARPAL TUNNEL RELEASE Right 07/10/2018   Procedure: CARPAL TUNNEL RELEASE;  Surgeon: Cindee Salt, MD;  Location: [facility];  Service: Orthopedics;  Laterality: Right;  FAB  . dental implants    . HERNIA REPAIR  01/27/11   bilateral inguinal hernia   . SHOULDER SURGERY Right 1990  . TONSILLECTOMY AND ADENOIDECTOMY      Family History  Problem Relation Age of Onset  . COPD Mother    Social History:  reports that he has quit smoking. His smoking use included cigarettes. He quit after 15.00 years of use. He has never used smokeless tobacco. He reports that he does not drink alcohol or use  drugs.  Allergies: No Known Allergies  No medications prior to admission.    No results found for this or any previous visit (from the past 48 hour(s)).  No results found.   Pertinent items are noted in HPI.  Height 5\' 10"  (1.778 m), weight 71.7 kg.  General appearance: alert, cooperative and appears stated age Head: Normocephalic, without obvious abnormality Neck: no JVD Resp: clear to auscultation bilaterally Cardio: regular rate and rhythm, S1, S2 normal, no murmur, click, rub or gallop GI: soft, non-tender; bowel sounds normal; no masses,  no organomegaly Extremities: numbness left hand Pulses: 2+ and symmetric Skin: Skin color, texture, turgor normal. No rashes or lesions Neurologic: Grossly normal Incision/Wound: Na   Assessment/Plan Assessment:  1. Bilateral carpal tunnel syndrome    Plan:  He would like to proceed to surgical intervention. Pre-peri-and postoperative course are discussed along with risks and complications. He is aware that there is no guarantee to the surgery the possibility of infection recurrence injury to arteries nerves tendons complete relief symptoms dystrophy all discussed with him. Questionsare encouraged and answered to his satisfaction. Is scheduled for carpal tunnel release left hand as an outpatient under regional anesthesia   Cindee Salt 09/25/2018, 5:13 AM

## 2018-09-25 NOTE — Brief Op Note (Signed)
09/25/2018  9:08 AM  PATIENT:  David Reyes  68 y.o. male  PRE-OPERATIVE DIAGNOSIS:  LEFT CARPAL TUNNEL SYNDROME  POST-OPERATIVE DIAGNOSIS:  LEFT CARPAL TUNNEL SYNDROME  PROCEDURE:  Procedure(s) with comments: LEFT CARPAL TUNNEL RELEASE (Left) - FAB  SURGEON:  Surgeon(s) and Role:    * Cindee Salt, MD - Primary  PHYSICIAN ASSISTANT:   ASSISTANTS: none   ANESTHESIA:   local, regional and IV sedation  EBL: 5ml BLOOD ADMINISTERED:none  DRAINS: none   LOCAL MEDICATIONS USED:  BUPIVICAINE   SPECIMEN:  No Specimen  DISPOSITION OF SPECIMEN:  N/A  COUNTS:  YES  TOURNIQUET:   Total Tourniquet Time Documented: Forearm (Left) - 20 minutes Total: Forearm (Left) - 20 minutes   DICTATION: .Dragon Dictation  PLAN OF CARE: Discharge to home after PACU  PATIENT DISPOSITION:  PACU - hemodynamically stable.

## 2018-09-25 NOTE — Discharge Instructions (Addendum)

## 2018-09-25 NOTE — Anesthesia Postprocedure Evaluation (Signed)
Anesthesia Post Note  Patient: David Reyes  Procedure(s) Performed: LEFT CARPAL TUNNEL RELEASE (Left Wrist)     Patient location during evaluation: PACU Anesthesia Type: Bier Block Level of consciousness: awake and alert Pain management: pain level controlled Vital Signs Assessment: post-procedure vital signs reviewed and stable Respiratory status: spontaneous breathing, nonlabored ventilation, respiratory function stable and patient connected to nasal cannula oxygen Cardiovascular status: stable and blood pressure returned to baseline Postop Assessment: no apparent nausea or vomiting Anesthetic complications: no    Last Vitals:  Vitals:   09/25/18 0915 09/25/18 0930  BP: 125/68 124/72  Pulse: (!) 49 (!) 51  Resp: 12 11  Temp:    SpO2: 100% 100%    Last Pain:  Vitals:   09/25/18 0930  TempSrc:   PainSc: 0-No pain                 [name]

## 2018-09-25 NOTE — Op Note (Signed)
NAME: David Reyes MEDICAL RECORD NO: [id number] DATE OF BIRTH: 01-Jan-1951 FACILITY: [facility] LOCATION: [facility] PHYSICIAN: Nicki Reaper, MD   OPERATIVE REPORT   DATE OF PROCEDURE: 09/25/18    PREOPERATIVE DIAGNOSIS:   Carpal tunnel syndrome left hand   POSTOPERATIVE DIAGNOSIS:   Same   PROCEDURE:   Decompression median nerve left hand   SURGEON: Cindee Salt, M.D.   ASSISTANT: none   ANESTHESIA:  Bier block with sedation and Local   INTRAVENOUS FLUIDS:  Per anesthesia flow sheet.   ESTIMATED BLOOD LOSS:  Minimal.   COMPLICATIONS:  None.   SPECIMENS:  none   TOURNIQUET TIME:    Total Tourniquet Time Documented: Forearm (Left) - 20 minutes Total: Forearm (Left) - 20 minutes    DISPOSITION:  Stable to PACU.   INDICATIONS: Patient is a 68 year old male with history of bilateral carpal tunnel syndrome.  He is undergone release on his right side is admitted now for release to the left nerve conductions are positive and he is not responded to conservative treatment.  Pre-peri-and postoperative course been discussed along with risk complications.  He is aware that there is no guarantee to the surgery the possibility of infection recurrence injury to arteries nerves tendons complete relief symptoms and dystrophy.  In the preoperative area the patient is seen extremity marked by both patient and surgeon antibiotic given  OPERATIVE COURSE: Patient is brought to the operating room where form based IV regional anesthetic was carried out without difficulty.  He was prepped using ChloraPrep and in supine position with the left arm free.  A three-minute dry time was allowed and a timeout taken to confirm patient procedure.  A longitudinal incision was made left palm carried down through subcutaneous tissue.  Bleeders were electrocauterized with bipolar.  The palmar fascia was split.  The superficial palmar arch was identified along with flexor tendon the ring to the ring  and little finger was identified retractors were placed retracting median nerve radially ulnar nerve ulnarly.  The flexor retinaculum was then released on its ulnar border.  Right angle and stool retractor placed between skin and forearm fascia.  The fascia was released for approximately 2 to 3 cm proximal to the wrist crease under direct vision.  The canal was explored.  There area compression of the nerve was apparent.  Motor branch entered in the muscle distally.  The wound was copiously irrigated with saline.  The skin was closed interrupted 4-0 nylon sutures.  Local infiltration quarter percent bupivacaine without epinephrine was given.  Approximately 9 cc was used.  Sterile compressive dressing with the fingers free was applied.  And deflation of the tourniquet all fingers immediately pink.  He was taken to the recovery room for observation in satisfactory condition.  He will be discharged home to return to hand center [facility] in 1 week on Tylenol ibuprofen for discomfort with Ultram as a breakthrough.   Cindee Salt, MD Electronically signed, 09/25/18

## 2018-09-25 NOTE — Transfer of Care (Signed)
Immediate Anesthesia Transfer of Care Note  Patient: David Reyes  Procedure(s) Performed: LEFT CARPAL TUNNEL RELEASE (Left Wrist)  Patient Location: PACU  Anesthesia Type:Bier block  Level of Consciousness: awake, sedated and patient cooperative  Airway & Oxygen Therapy: Patient Spontanous Breathing and Patient connected to nasal cannula oxygen  Post-op Assessment: Report given to RN and Post -op Vital signs reviewed and stable  Post vital signs: Reviewed and stable  Last Vitals:  Vitals Value Taken Time  BP 119/68 09/25/2018  9:10 AM  Temp    Pulse 53 09/25/2018  9:11 AM  Resp 15 09/25/2018  9:11 AM  SpO2 100 % 09/25/2018  9:11 AM  Vitals shown include unvalidated device data.  Last Pain:  Vitals:   09/25/18 0718  TempSrc: Oral  PainSc: 0-No pain      Patients Stated Pain Goal: 0 (09/25/18 4196)  Complications: No apparent anesthesia complications

## 2018-09-26 ENCOUNTER — Encounter (HOSPITAL_BASED_OUTPATIENT_CLINIC_OR_DEPARTMENT_OTHER): Payer: Self-pay | Admitting: Orthopedic Surgery

## 2020-03-16 ENCOUNTER — Other Ambulatory Visit: Payer: Self-pay | Admitting: Adult Health

## 2020-03-16 DIAGNOSIS — Z720 Tobacco use: Secondary | ICD-10-CM

## 2020-03-16 DIAGNOSIS — F17210 Nicotine dependence, cigarettes, uncomplicated: Secondary | ICD-10-CM

## 2020-05-06 IMAGING — CR DG NECK SOFT TISSUE
2 series · 2 of 2 positions shown · non-contrast
Comparison: None.

CLINICAL DATA: Swollen bilateral cervical lymph nodes.

EXAM:
NECK SOFT TISSUES - 1+ VIEW

[w soft tissue neck ap]
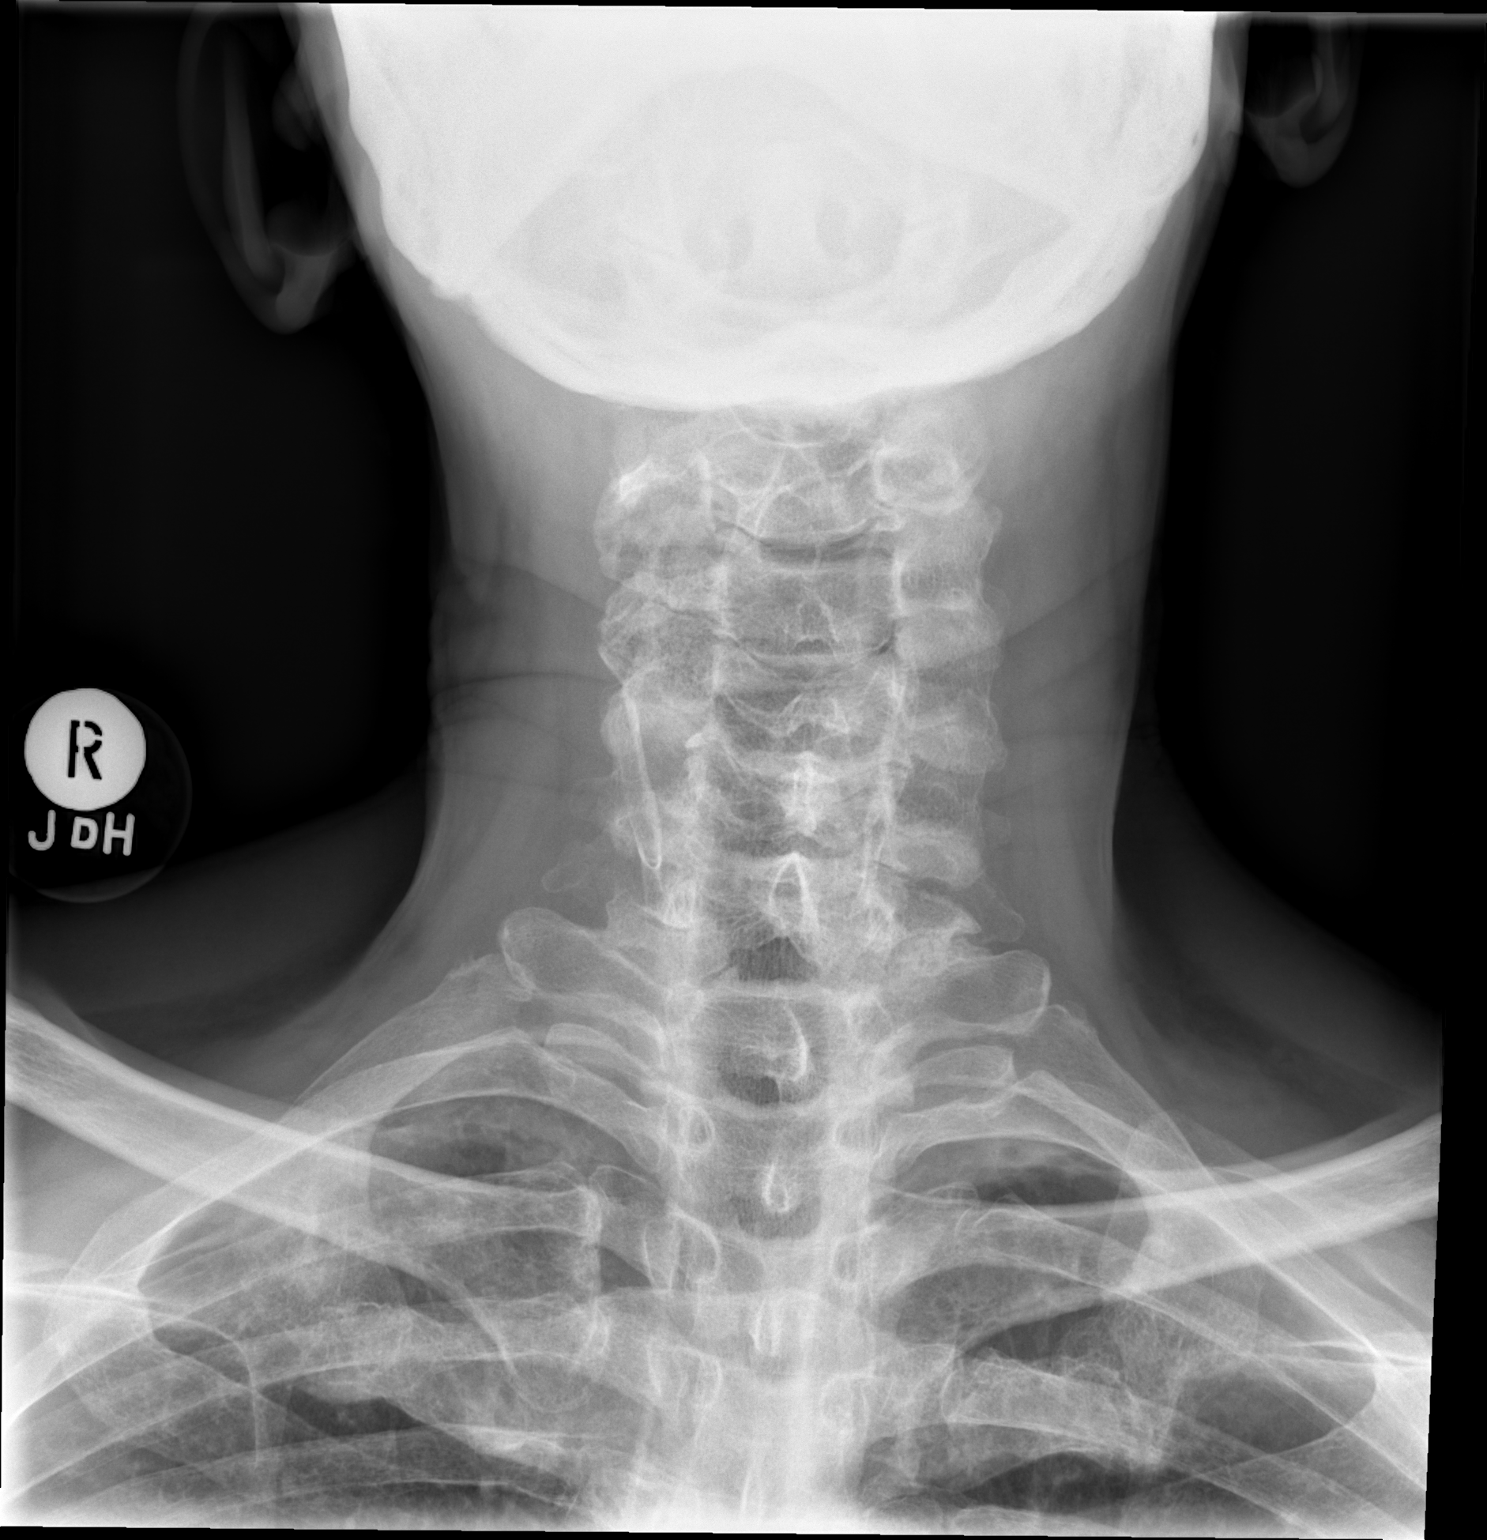

[w soft tissue neck lat]
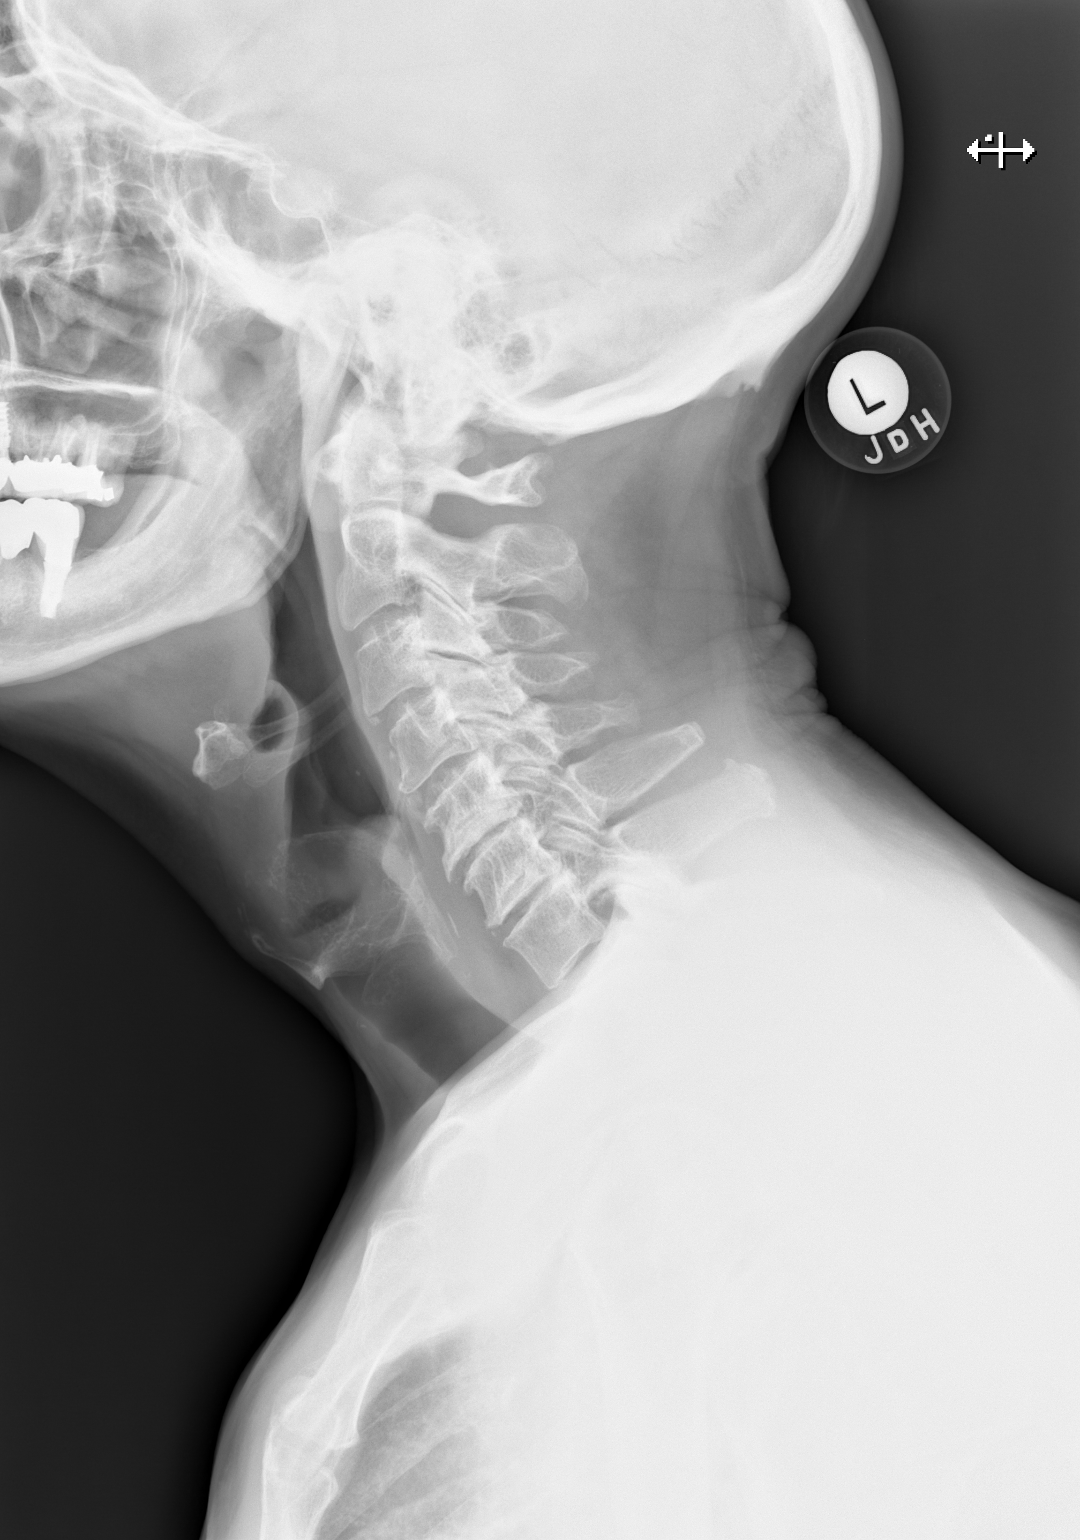

[2 of 2 positions shown; findings below may reference images not displayed]

FINDINGS: There is no evidence of retropharyngeal soft tissue swelling or
epiglottic enlargement. The cervical airway is unremarkable and no
radio-opaque foreign body identified.
IMPRESSION: Negative.

## 2020-07-13 IMAGING — US US ABDOMINAL AORTA SCREENING AAA
1 series · 14 of 15 positions shown · non-contrast
Comparison: None.

CLINICAL DATA: Screening for abdominal aortic aneurysm.

EXAM:
US ABDOMINAL AORTA MEDICARE SCREENING
TECHNIQUE: Ultrasound examination of the abdominal aorta was performed as a
screening evaluation for abdominal aortic aneurysm.

[Series 1: us abdominal aorta screening aaa · 0.20mm/px · 14 of 15 slices shown]
[im 1/15]
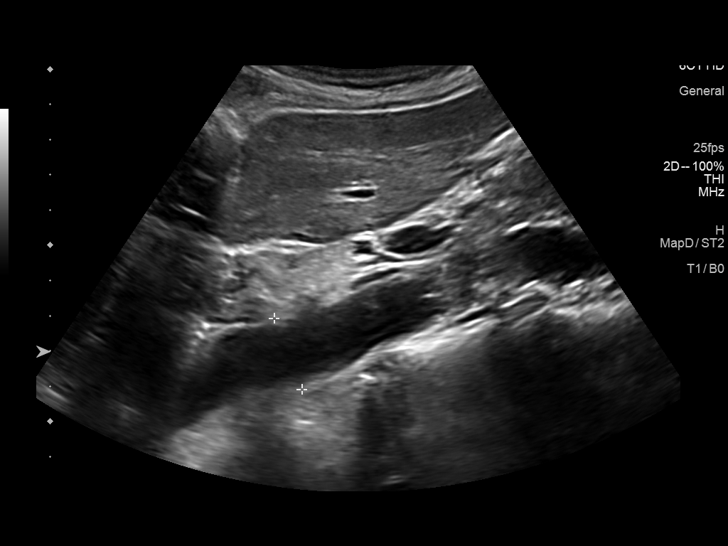
[im 2/15]
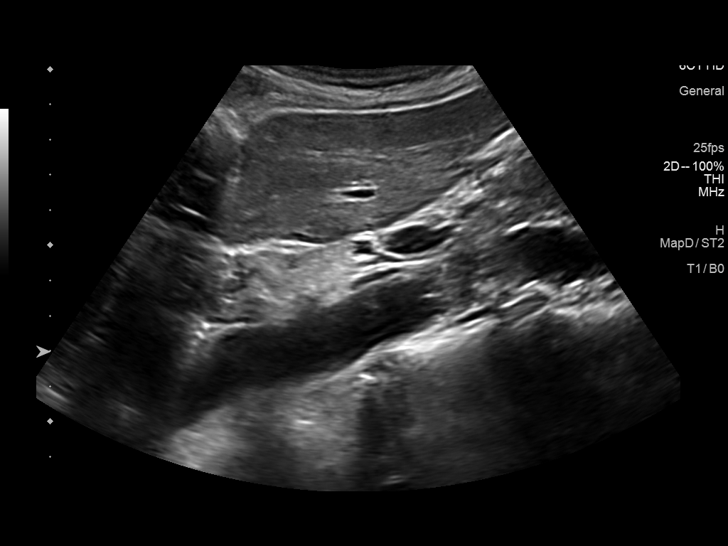
[im 3/15]
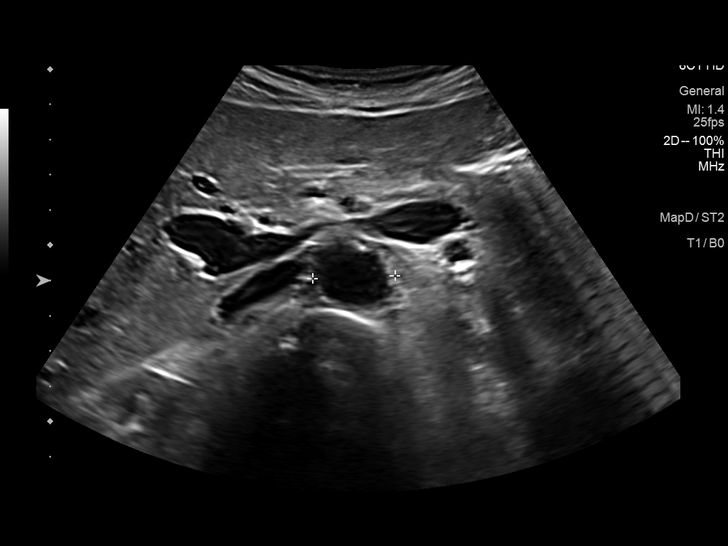
[im 4/15]
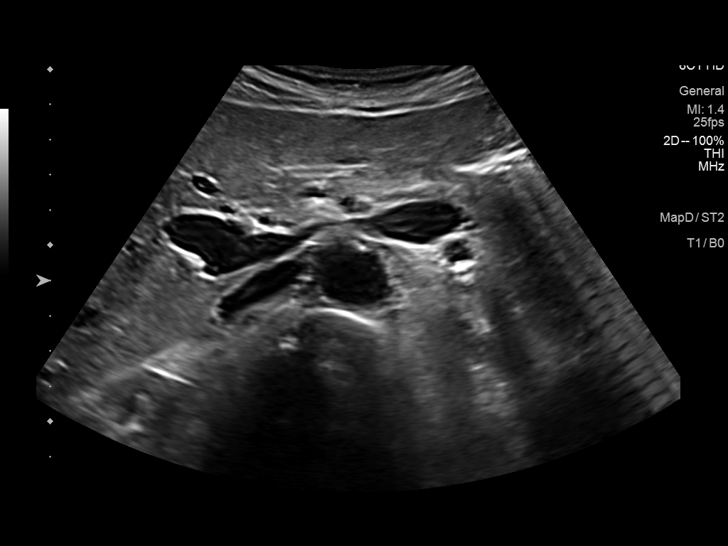
[im 5/15]
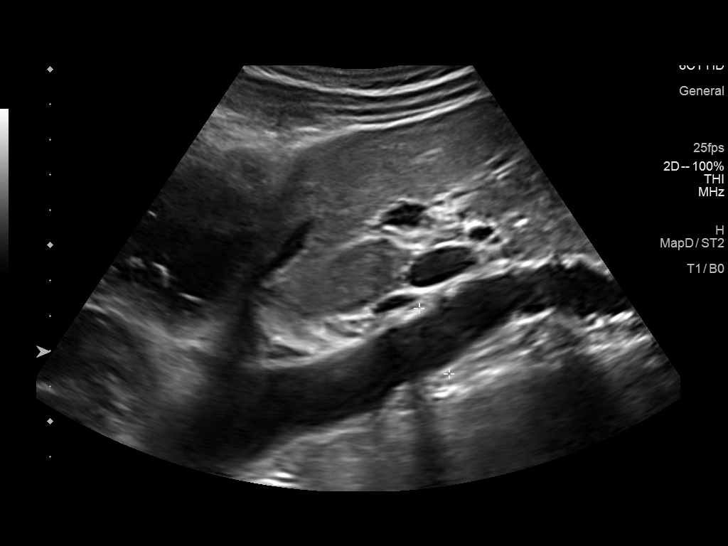
[im 6/15]
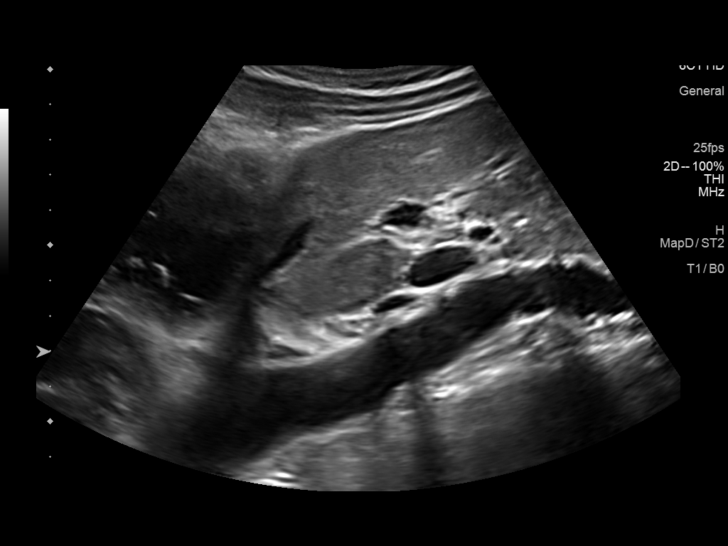
[im 7/15]
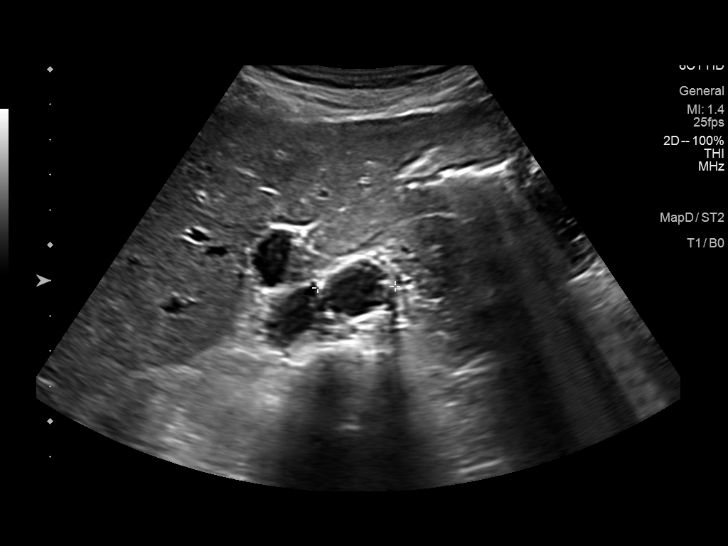
[im 9/15]
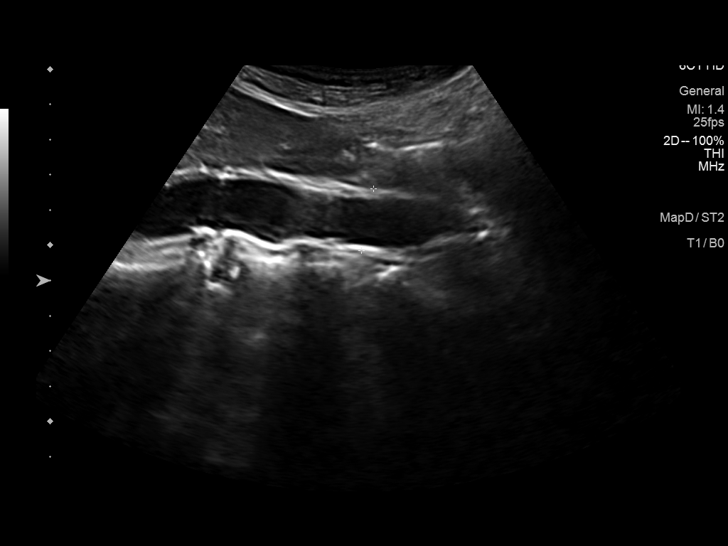
[im 10/15]
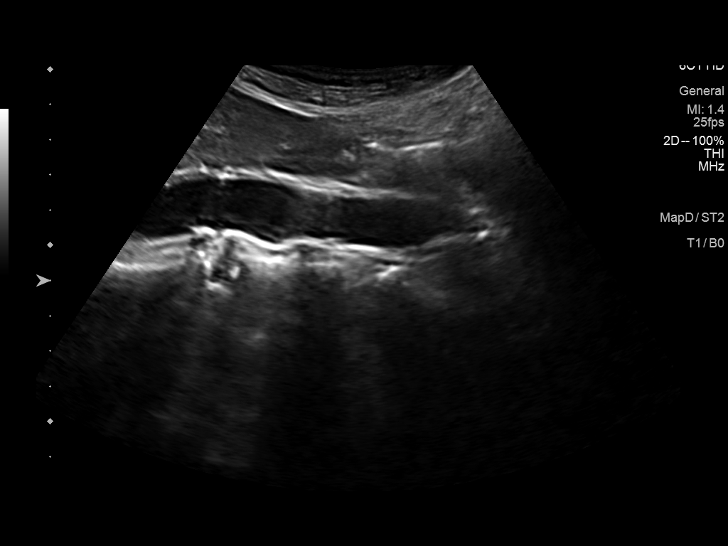
[im 11/15]
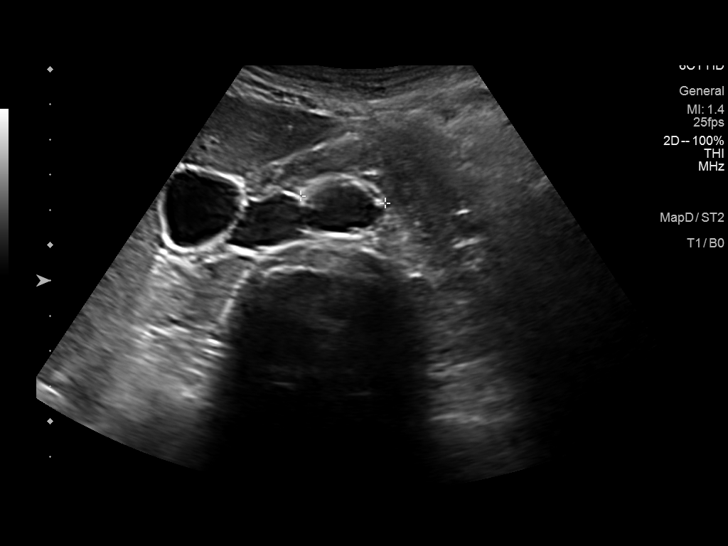
[im 12/15]
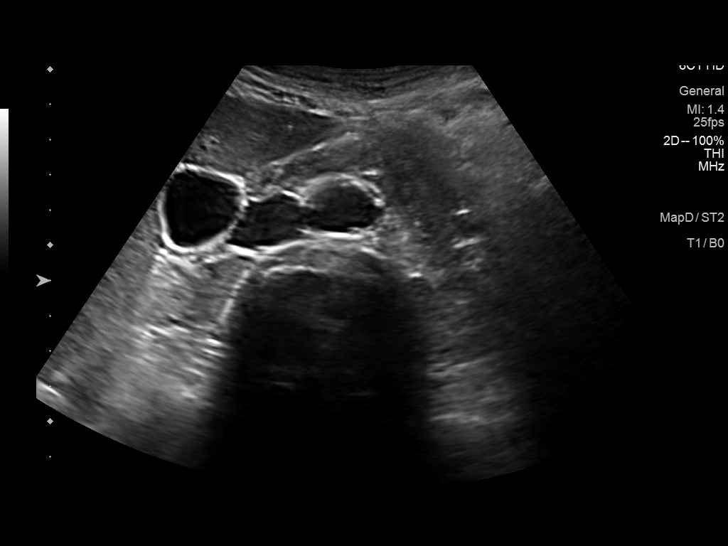
[im 13/15]
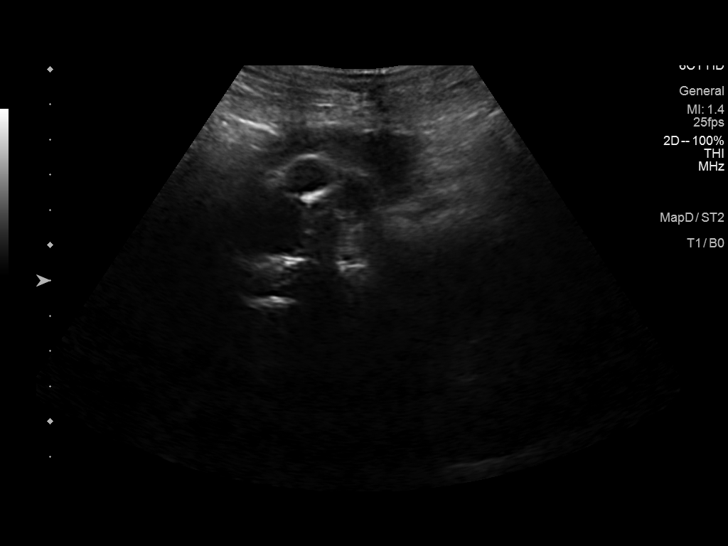
[im 14/15]
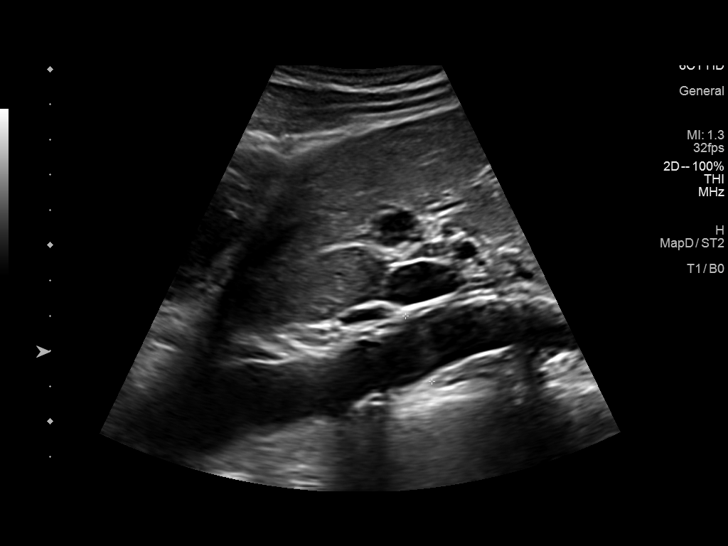
[im 15/15]
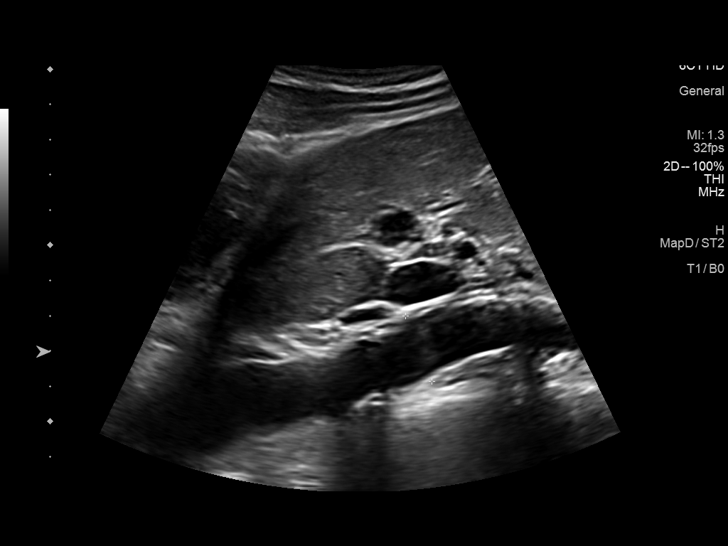

[14 of 15 positions shown; findings below may reference images not displayed]

FINDINGS: Abdominal aortic measurements as follows:

Proximal:  2.3 cm

Mid:  2.2 cm

Distal:  2.4 cm
IMPRESSION: No evidence of abdominal aortic aneurysm.

Aortic Atherosclerosis ([id number]).

## 2020-10-04 ENCOUNTER — Encounter (HOSPITAL_COMMUNITY): Payer: Self-pay | Admitting: Emergency Medicine

## 2020-10-04 ENCOUNTER — Ambulatory Visit (HOSPITAL_COMMUNITY)
Admission: EM | Admit: 2020-10-04 | Discharge: 2020-10-04 | Disposition: A | Discharge status: Home / Self Care | Payer: Managed Care, Other (non HMO) | Attending: Medical Oncology | Admitting: Medical Oncology

## 2020-10-04 DIAGNOSIS — Z87891 Personal history of nicotine dependence: Secondary | ICD-10-CM | POA: Diagnosis not present

## 2020-10-04 DIAGNOSIS — Z79899 Other long term (current) drug therapy: Secondary | ICD-10-CM | POA: Diagnosis not present

## 2020-10-04 DIAGNOSIS — Z20822 Contact with and (suspected) exposure to covid-19: Secondary | ICD-10-CM | POA: Diagnosis not present

## 2020-10-04 DIAGNOSIS — W57XXXA Bitten or stung by nonvenomous insect and other nonvenomous arthropods, initial encounter: Secondary | ICD-10-CM | POA: Insufficient documentation

## 2020-10-04 DIAGNOSIS — E785 Hyperlipidemia, unspecified: Secondary | ICD-10-CM | POA: Insufficient documentation

## 2020-10-04 DIAGNOSIS — S30862A Insect bite (nonvenomous) of penis, initial encounter: Secondary | ICD-10-CM | POA: Diagnosis present

## 2020-10-04 DIAGNOSIS — L03818 Cellulitis of other sites: Secondary | ICD-10-CM | POA: Insufficient documentation

## 2020-10-04 DIAGNOSIS — Z1152 Encounter for screening for COVID-19: Secondary | ICD-10-CM | POA: Diagnosis not present

## 2020-10-04 LAB — SARS CORONAVIRUS 2 (TAT 6-24 HRS): SARS Coronavirus 2: NEGATIVE

## 2020-10-04 MED ORDER — DOXYCYCLINE HYCLATE 100 MG PO CAPS: 100.0000 mg | ORAL_CAPSULE | Freq: Two times a day (BID) | ORAL | 0 refills | Status: AC

## 2020-10-04 NOTE — Discharge Instructions (Addendum)
-  Doxycycline twice daily for 7 days.  Make sure to wear sunscreen while on this medication as it can increase your chance of sunburn.  This will treat the skin infection (cellulitis).  It is also the treatment for Lyme's disease and [facility] spotted fever, so this will treat or prevent any possible infection. -With COVID, you are contagious for 5 days with mild symptoms or 10 days with moderate to severe symptoms.  The test that we took today should come back tomorrow.  This will go straight to your email in my chart, and you will receive a call if this is positive. -Seek additional medical attention if you develop new symptoms like chest pain, dizziness, worsening of the penile pain and swelling, discharge from the tick bites, etc.

## 2020-10-04 NOTE — ED Provider Notes (Signed)
[facility]    CSN: [id number] Arrival date & time: 10/04/20  1318      History   Chief Complaint Chief Complaint  Patient presents with  . Insect Bite  . Groin Swelling  . COVID TEST    HPI David Reyes is a 70 y.o. male presenting with groin swelling following 2 tick bites to penis that he sustained 2 days ago.  Medical history abdominal pain, anemia, constipation, hyperlipidemia, rectal bleeding.  Patient also states that he took an at home COVID test due to having a temperature of 99, and this was positive.  States that he was gardening 2 days ago and had multiple ticks latch onto his penis.  States he was able to remove them with minimal difficulty, but now he is concerned that the base of his penis is red and a bit swollen.  Endorses some fatigue and so he took a COVID test, this was positive.  Denies absolutely any viral URI symptoms including cough, congestion, fevers over 100, nausea/vomiting/diarrhea, etc.  HPI  Past Medical History:  Diagnosis Date  . Abdominal pain   . Anemia   . Constipation   . Hyperlipidemia   . Rectal bleeding     Patient Active Problem List   Diagnosis Date Noted  . Hemorrhoids 02/07/2011  . Bilateral inguinal hernia (BIH), s/p lap repair [id number] 02/07/2011    Past Surgical History:  Procedure Laterality Date  . CARPAL TUNNEL RELEASE Right 07/10/2018   Procedure: CARPAL TUNNEL RELEASE;  Surgeon: Cindee Salt, MD;  Location: [facility];  Service: Orthopedics;  Laterality: Right;  FAB  . CARPAL TUNNEL RELEASE Left 09/25/2018   Procedure: LEFT CARPAL TUNNEL RELEASE;  Surgeon: Cindee Salt, MD;  Location: [facility];  Service: Orthopedics;  Laterality: Left;  FAB  . dental implants    . HERNIA REPAIR  01/27/11   bilateral inguinal hernia   . SHOULDER SURGERY Right 1990  . TONSILLECTOMY AND ADENOIDECTOMY         Home Medications    Prior to Admission medications   Medication Sig Start Date End  Date Taking? Authorizing Provider  doxycycline (VIBRAMYCIN) 100 MG capsule Take 1 capsule (100 mg total) by mouth 2 (two) times daily for 7 days. 10/04/20 10/11/20 Yes Rhys Martini, PA-C  CIALIS 5 MG tablet  12/22/10   [provider]  escitalopram (LEXAPRO) 10 MG tablet Take 1 tablet by mouth daily. 08/17/20   [provider]  ferrous sulfate 325 (65 FE) MG EC tablet Take 325 mg by mouth daily.     [provider]  Glucosamine-Chondroit-Vit C-Mn (GLUCOSAMINE 1500 COMPLEX PO) Take by mouth daily.      [provider]  Multiple Vitamin (MULTIVITAMIN PO) Take by mouth daily.      [provider]  niacin (SLO-NIACIN) 500 MG tablet Take 500 mg by mouth at bedtime.    [provider]  Red Yeast Rice Extract (RED YEAST RICE PO) Take 1,200 mg elemental calcium/kg/hr by mouth.      [provider]  tamsulosin (FLOMAX) 0.4 MG CAPS capsule Take 0.4 mg by mouth.    [provider]  traMADol (ULTRAM) 50 MG tablet Take 1 tablet (50 mg total) by mouth every 6 (six) hours as needed. 09/25/18   Cindee Salt, MD    Family History Family History  Problem Relation Age of Onset  . COPD Mother     Social History Social History   Tobacco Use  .  Smoking status: Former Smoker    Years: 15.00    Types: Cigarettes  . Smokeless tobacco: Never Used  . Tobacco comment: has not smoked in 40 days  Vaping Use  . Vaping Use: Never used  Substance Use Topics  . Alcohol use: No  . Drug use: No     Allergies   Patient has no known allergies.   Review of Systems Review of Systems  Constitutional: Negative for appetite change, chills and fever.  HENT: Negative for congestion, ear pain, rhinorrhea, sinus pressure, sinus pain and sore throat.   Eyes: Negative for redness and visual disturbance.  Respiratory: Negative for cough, chest tightness, shortness of breath and wheezing.   Cardiovascular: Negative for chest pain and palpitations.   Gastrointestinal: Negative for abdominal pain, constipation, diarrhea, nausea and vomiting.  Genitourinary: Negative for dysuria, frequency and urgency.  Musculoskeletal: Negative for myalgias.  Skin: Positive for rash.  Neurological: Negative for dizziness, weakness and headaches.  Psychiatric/Behavioral: Negative for confusion.  All other systems reviewed and are negative.    Physical Exam Triage Vital Signs ED Triage Vitals  Enc Vitals Group     BP 10/04/20 1518 (!) 116/53     Pulse Rate 10/04/20 1518 87     Resp 10/04/20 1518 17     Temp 10/04/20 1518 98.9 F (37.2 C)     Temp Source 10/04/20 1518 Oral     SpO2 10/04/20 1518 99 %     Weight --      Height --      Head Circumference --      Peak Flow --      Pain Score 10/04/20 1515 3     Pain Loc --      Pain Edu? --      Excl. in GC? --    No data found.  Updated Vital Signs BP (!) 116/53 (BP Location: Left Arm)   Pulse 87   Temp 98.9 F (37.2 C) (Oral)   Resp 17   SpO2 99%   Visual Acuity Right Eye Distance:   Left Eye Distance:   Bilateral Distance:    Right Eye Near:   Left Eye Near:    Bilateral Near:     Physical Exam Vitals reviewed. Exam conducted with a chaperone present.  Constitutional:      General: He is not in acute distress.    Appearance: Normal appearance. He is not ill-appearing or diaphoretic.  HENT:     Head: Normocephalic and atraumatic.  Eyes:     Extraocular Movements: Extraocular movements intact.     Pupils: Pupils are equal, round, and reactive to light.  Cardiovascular:     Rate and Rhythm: Normal rate and regular rhythm.     Heart sounds: Normal heart sounds.  Pulmonary:     Effort: Pulmonary effort is normal.     Breath sounds: Normal breath sounds.  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  There is erythema and induration at the base of the shaft.  No warmth, mildly tender.  No inguinal  lymphadenopathy. Skin:    General: Skin is warm.     Capillary Refill: Capillary refill takes less than 2 seconds.     Comments: No bullseye rash   Neurological:     General: No focal deficit present.     Mental Status: He is alert and oriented to person, place, and time.  Psychiatric:  Mood and Affect: Mood normal.        Behavior: Behavior normal.        Thought Content: Thought content normal.        Judgment: Judgment normal.      UC Treatments / Results  Labs (all labs ordered are listed, but only abnormal results are displayed) Labs Reviewed  SARS CORONAVIRUS 2 (TAT 6-24 HRS)    EKG   Radiology No results found.  Procedures Procedures (including critical care time)  Medications Ordered in UC Medications - No data to display  Initial Impression / Assessment and Plan / UC Course  I have reviewed the triage vital signs and the nursing notes.  Pertinent labs & imaging results that were available during my care of the patient were reviewed by me and considered in my medical decision making (see chart for details).     This patient is a 70 year old male presenting with cellulitis following 2 tick bites to his penis.  He is afebrile, nontachycardic.  This patient did take a home COVID test following the bites which was positive, so he is requesting COVID PCR sent today.  There is a small area of induration and erythema at the base of patient's penis, so plan to treat for cellulitis with doxycycline as below.  This will also cover for Lyme and RMSF.  ED return precautions discussed.  Final Clinical Impressions(s) / UC Diagnoses   Final diagnoses:  Cellulitis of other specified site  Tick bite of penis, initial encounter  Encounter for screening for COVID-19     Discharge Instructions     -Doxycycline twice daily for 7 days.  Make sure to wear sunscreen while on this medication as it can increase your chance of sunburn.  This will treat the skin  infection (cellulitis).  It is also the treatment for Lyme's disease and [facility] spotted fever, so this will treat or prevent any possible infection. -With COVID, you are contagious for 5 days with mild symptoms or 10 days with moderate to severe symptoms.  The test that we took today should come back tomorrow.  This will go straight to your email in my chart, and you will receive a call if this is positive. -Seek additional medical attention if you develop new symptoms like chest pain, dizziness, worsening of the penile pain and swelling, discharge from the tick bites, etc.    ED Prescriptions    Medication Sig Dispense Auth. Provider   doxycycline (VIBRAMYCIN) 100 MG capsule Take 1 capsule (100 mg total) by mouth 2 (two) times daily for 7 days. 14 capsule Rhys Martini, PA-C     PDMP not reviewed this encounter.   Rhys Martini, PA-C 10/04/20 1641

## 2020-10-04 NOTE — ED Triage Notes (Signed)
Pt presents with two tick bites on penis with groin swelling xs 2 days. States took COVID test at home due to having temp of 99.1 and got positive test results.

## 2021-11-08 ENCOUNTER — Other Ambulatory Visit: Payer: Self-pay | Admitting: Adult Health

## 2021-11-08 DIAGNOSIS — Z87891 Personal history of nicotine dependence: Secondary | ICD-10-CM

## 2021-12-07 ENCOUNTER — Ambulatory Visit
Admission: RE | Admit: 2021-12-07 | Discharge: 2021-12-07 | Disposition: A | Discharge status: Home / Self Care | Payer: Managed Care, Other (non HMO) | Source: Ambulatory Visit | Attending: Adult Health | Admitting: Adult Health

## 2021-12-07 DIAGNOSIS — Z87891 Personal history of nicotine dependence: Secondary | ICD-10-CM

## 2022-12-20 ENCOUNTER — Other Ambulatory Visit: Payer: Self-pay | Admitting: Adult Health

## 2022-12-20 DIAGNOSIS — Z122 Encounter for screening for malignant neoplasm of respiratory organs: Secondary | ICD-10-CM

## 2023-01-05 ENCOUNTER — Ambulatory Visit
Admission: RE | Admit: 2023-01-05 | Discharge: 2023-01-05 | Disposition: A | Discharge status: Home / Self Care | Payer: Managed Care, Other (non HMO) | Source: Ambulatory Visit | Attending: Adult Health | Admitting: Adult Health

## 2023-01-05 DIAGNOSIS — Z122 Encounter for screening for malignant neoplasm of respiratory organs: Secondary | ICD-10-CM

## 2023-07-31 ENCOUNTER — Other Ambulatory Visit: Payer: Self-pay | Admitting: Adult Health

## 2023-07-31 DIAGNOSIS — Z720 Tobacco use: Secondary | ICD-10-CM

## 2023-08-16 ENCOUNTER — Ambulatory Visit
Admission: RE | Admit: 2023-08-16 | Discharge: 2023-08-16 | Disposition: A | Discharge status: Home / Self Care | Source: Ambulatory Visit | Attending: Adult Health | Admitting: Adult Health

## 2023-08-16 DIAGNOSIS — Z720 Tobacco use: Secondary | ICD-10-CM

## 2024-02-07 ENCOUNTER — Telehealth: Payer: Self-pay

## 2024-02-07 NOTE — Telephone Encounter (Signed)
 Copied from CRM [id number]. Topic: Appointments - Scheduling Inquiry for Clinic >> Feb 05, 2024 10:58 AM Ismael A wrote: Reason for CRM: patient called back regarding 9/25-lvmtcb to switch to 10/7 at 3:15pm with Dr. Rockney Nodules  - patient is ok with this new schedule, please follow up to confirm switch    Spoke w/ PT have him schedule with Dr. Kara as new PT - new time / date due to the previous appointment being booked already     VBU.    -NFN

## 2024-02-12 ENCOUNTER — Ambulatory Visit: Admitting: Pulmonary Disease

## 2024-02-12 ENCOUNTER — Encounter: Payer: Self-pay | Admitting: Pulmonary Disease

## 2024-02-12 VITALS — BP 117/65 | HR 74 | Ht 69.5 in | Wt 176.0 lb

## 2024-02-12 DIAGNOSIS — Z87891 Personal history of nicotine dependence: Secondary | ICD-10-CM | POA: Diagnosis not present

## 2024-02-12 DIAGNOSIS — J439 Emphysema, unspecified: Secondary | ICD-10-CM | POA: Diagnosis not present

## 2024-02-12 DIAGNOSIS — R911 Solitary pulmonary nodule: Secondary | ICD-10-CM | POA: Diagnosis not present

## 2024-02-12 DIAGNOSIS — J432 Centrilobular emphysema: Secondary | ICD-10-CM

## 2024-02-12 NOTE — Patient Instructions (Addendum)
 Your CT Chest scan shows a 9mm right lung nodule  We will schedule you for a repeat CT Chest scan in the next couple of weeks and call you with the results. We will monitor the nodule for now.  Follow up in 6 months, call sooner if needed

## 2024-02-12 NOTE — Progress Notes (Signed)
 Subjective:   PATIENT ID: David Reyes GENDER: male DOB: 12-04-1950, MRN: [id number]  HPI Discussed the use of AI scribe software for clinical note transcription with the patient, who gave verbal consent to proceed.  History of Present Illness   David Reyes is a 73 year old male who presents for follow-up on a lung nodule.   The lung nodule has increased in size from 0.7 mm to 0.9 mm over the past year, as noted in a scan conducted five months ago. It is located in the right lung.  He has a significant smoking history, having quit approximately five and a half years ago. He smoked about half a pack per day since his late teens, with two breaks totaling about three and a half years. He currently uses nicotine lozenges as needed.  He has been informed of having a very mild form of emphysema. He does not experience dyspnea but notices increased respiratory effort with exertion. He does not use inhalers. There are no recent weight changes, loss of appetite, or wheezing. He reports improved breathing since quitting smoking.  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   Past Medical History:  Diagnosis Date   Abdominal pain    Anemia    Constipation    Hyperlipidemia    Rectal bleeding      Family History  Problem Relation Age of Onset   COPD Mother      Social History   Socioeconomic History   Marital status: Married    Spouse name: Not on file   Number of children: Not on file   Years of education: Not on file   Highest education level: Not on file  Occupational History   Not on file  Tobacco Use   Smoking status: Former    Types: Cigarettes   Smokeless tobacco: Never   Tobacco comments:    has not smoked in 40 days  Vaping Use   Vaping status: Never Used  Substance and Sexual Activity   Alcohol use: No   Drug use: No   Sexual activity: Not on file  Other Topics Concern   Not on file  Social History Narrative    Lives with son   Social Drivers of Corporate investment banker Strain: Not on file  Food Insecurity: Not on file  Transportation Needs: Not on file  Physical Activity: Not on file  Stress: Not on file  Social Connections: Not on file  Intimate Partner Violence: Not on file     No Known Allergies   Outpatient Medications Prior to Visit  Medication Sig Dispense Refill   CIALIS 5 MG tablet      escitalopram (LEXAPRO) 10 MG tablet Take 1 tablet by mouth daily.     ferrous sulfate 325 (65 FE) MG EC tablet Take 325 mg by mouth daily.      Glucosamine-Chondroit-Vit C-Mn (GLUCOSAMINE 1500 COMPLEX PO) Take by mouth daily.       Multiple Vitamin (MULTIVITAMIN PO) Take by mouth daily.       niacin (SLO-NIACIN) 500 MG tablet Take 500 mg by mouth at bedtime.     Red Yeast Rice Extract (RED YEAST RICE PO) Take 1,200 mg elemental calcium/kg/hr by mouth.       tamsulosin (FLOMAX) 0.4 MG CAPS capsule Take 0.4 mg by mouth.     traMADol  (ULTRAM ) 50 MG tablet Take 1 tablet (50 mg total) by mouth every 6 (six) hours as  needed. 20 tablet 0   No facility-administered medications prior to visit.   Review of Systems  Constitutional:  Negative for chills, fever, malaise/fatigue and weight loss.  HENT:  Negative for congestion, sinus pain and sore throat.   Eyes: Negative.   Respiratory:  Negative for cough, hemoptysis, sputum production, shortness of breath and wheezing.   Cardiovascular:  Negative for chest pain, palpitations, orthopnea, claudication and leg swelling.  Gastrointestinal:  Negative for abdominal pain, heartburn, nausea and vomiting.  Genitourinary: Negative.   Musculoskeletal:  Negative for joint pain and myalgias.  Skin:  Negative for rash.  Neurological:  Negative for weakness.  Endo/Heme/Allergies: Negative.   Psychiatric/Behavioral: Negative.     Objective:   Vitals:   02/12/24 1457  BP: 117/65  Pulse: 74  SpO2: 96%  Weight: 176 lb (79.8 kg)  Height: 5' 9.5 (1.765 m)    Physical Exam Constitutional:      General: He is not in acute distress.    Appearance: Normal appearance.  Eyes:     General: No scleral icterus.    Conjunctiva/sclera: Conjunctivae normal.  Cardiovascular:     Rate and Rhythm: Normal rate and regular rhythm.  Pulmonary:     Breath sounds: No wheezing, rhonchi or rales.  Musculoskeletal:     Right lower leg: No edema.     Left lower leg: No edema.  Skin:    General: Skin is warm and dry.  Neurological:     General: No focal deficit present.    CBC No results found for: WBC, RBC, HGB, HCT, PLT, MCV, MCH, MCHC, RDW, LYMPHSABS, MONOABS, EOSABS, BASOSABS   Chest imaging: [facility] Nodule F/u 08/16/23 Mediastinum/Nodes: No significant thyroid nodules. Unremarkable esophagus. No pathologically enlarged axillary, mediastinal or hilar lymph nodes, noting limited sensitivity for the detection of hilar adenopathy on this noncontrast study.   Lungs/Pleura: No pneumothorax. No pleural effusion. Mild centrilobular emphysema with diffuse bronchial wall thickening. No acute consolidative airspace disease or lung masses. Indistinct solid basilar right upper lobe pulmonary nodule on series 3/image 127 measuring 9.0 mm, mildly increased from 7.2 mm on 01/05/2023 and 6.7 mm on 12/07/2021 CT. Previously visualized part solid 10.7 mm left upper lobe nodule from 01/05/2023 CT has resolved. Additional smaller pulmonary nodules are stable. No new significant pulmonary nodules.  PFT:     No data to display          Labs:  Path:  Echo:  Heart Catheterization:    Assessment & Plan:   Centrilobular emphysema (HCC)  Pulmonary nodule  Assessment and Plan    Enlarging left pulmonary nodule Nodule increased from 0.7 mm to 0.9 mm over the past year. Concern for malignancy due to smoking history. Small size limits biopsy or PET scan utility. Differential includes cancer, inflammation, infection, or mucus. -  Order follow-up CT scan at [facility] in one month. - Message lung cancer screening team to schedule CT scan. - If stable, plan CT scan in six months. - If growth observed, consider biopsy.  Mild emphysema Likely due to long smoking history. Quit smoking five and a half years ago. No significant respiratory symptoms but increased effort with exertion. - Consider pulmonary function tests if shortness of breath worsens. - Discuss potential albuterol inhaler use if symptoms develop.  General Health Maintenance Up to date with COVID and flu vaccinations. Considering RSV vaccine. - Recommend RSV vaccine due to high efficacy. - Continue routine vaccinations as needed.      Follow up in 6 months  Dorn Chill, MD [facility] Office: [phone]   Current Outpatient Medications:    CIALIS 5 MG tablet, , Disp: , Rfl:    escitalopram (LEXAPRO) 10 MG tablet, Take 1 tablet by mouth daily., Disp: , Rfl:    ferrous sulfate 325 (65 FE) MG EC tablet, Take 325 mg by mouth daily. , Disp: , Rfl:    Glucosamine-Chondroit-Vit C-Mn (GLUCOSAMINE 1500 COMPLEX PO), Take by mouth daily.  , Disp: , Rfl:    Multiple Vitamin (MULTIVITAMIN PO), Take by mouth daily.  , Disp: , Rfl:    niacin (SLO-NIACIN) 500 MG tablet, Take 500 mg by mouth at bedtime., Disp: , Rfl:    Red Yeast Rice Extract (RED YEAST RICE PO), Take 1,200 mg elemental calcium/kg/hr by mouth.  , Disp: , Rfl:    tamsulosin (FLOMAX) 0.4 MG CAPS capsule, Take 0.4 mg by mouth., Disp: , Rfl:    traMADol  (ULTRAM ) 50 MG tablet, Take 1 tablet (50 mg total) by mouth every 6 (six) hours as needed., Disp: 20 tablet, Rfl: 0

## 2024-02-13 ENCOUNTER — Ambulatory Visit: Admitting: Acute Care

## 2024-02-14 ENCOUNTER — Other Ambulatory Visit: Payer: Self-pay | Admitting: *Deleted

## 2024-02-14 DIAGNOSIS — R911 Solitary pulmonary nodule: Secondary | ICD-10-CM

## 2024-02-14 DIAGNOSIS — Z87891 Personal history of nicotine dependence: Secondary | ICD-10-CM

## 2024-02-22 ENCOUNTER — Encounter: Payer: Self-pay | Admitting: Pulmonary Disease

## 2024-02-26 ENCOUNTER — Ambulatory Visit
Admission: RE | Admit: 2024-02-26 | Discharge: 2024-02-26 | Disposition: A | Discharge status: Home / Self Care | Source: Ambulatory Visit | Attending: Acute Care | Admitting: Acute Care

## 2024-02-26 DIAGNOSIS — R911 Solitary pulmonary nodule: Secondary | ICD-10-CM

## 2024-02-26 DIAGNOSIS — Z87891 Personal history of nicotine dependence: Secondary | ICD-10-CM

## 2024-02-29 ENCOUNTER — Telehealth: Payer: Self-pay

## 2024-02-29 NOTE — Telephone Encounter (Signed)
 Attempted to reach patient and review recent Lung CT results. Phone went straight to VM, VM full. Will try again later.   This scan was a 6 month follow up, nodule of concern previously 9.0 now 9.2 mm.   IMPRESSION: Stable pulmonary findings. Lung-RADS 3, probably benign findings. Short-term follow-up in 6 months is recommended with repeat low-dose chest CT without contrast (please use the following order, CT CHEST LCS NODULE FOLLOW-UP W/O CM).   Aortic Atherosclerosis (ICD10-I70.0) and Emphysema (ICD10-J43.9).

## 2024-03-01 ENCOUNTER — Other Ambulatory Visit: Payer: Self-pay

## 2024-03-01 DIAGNOSIS — Z122 Encounter for screening for malignant neoplasm of respiratory organs: Secondary | ICD-10-CM

## 2024-03-01 DIAGNOSIS — F17210 Nicotine dependence, cigarettes, uncomplicated: Secondary | ICD-10-CM

## 2024-03-01 DIAGNOSIS — Z87891 Personal history of nicotine dependence: Secondary | ICD-10-CM

## 2024-03-01 NOTE — Telephone Encounter (Signed)
 Spoke with patient and reviewed results. He will complete a 6 month follow up due around 08/2024 to monitor a 9.2 mm nodule. Order placed. Pt requested call back closer to due date to schedule. Results and plan to PCP. No questions from patient.
